# Patient Record
Sex: Female | Born: 1980 | Race: White | Hispanic: No | Marital: Married | Smoking: Never smoker
Health system: Southern US, Community
[De-identification: ages and names within clinical notes are randomized; demographics above are authoritative.]

## PROBLEM LIST (undated history)

## (undated) ENCOUNTER — Ambulatory Visit (HOSPITAL_COMMUNITY): Admission: EM | Payer: Self-pay

## (undated) DIAGNOSIS — G43909 Migraine, unspecified, not intractable, without status migrainosus: Secondary | ICD-10-CM

## (undated) DIAGNOSIS — R51 Headache: Secondary | ICD-10-CM

## (undated) DIAGNOSIS — N809 Endometriosis, unspecified: Secondary | ICD-10-CM

## (undated) DIAGNOSIS — E282 Polycystic ovarian syndrome: Secondary | ICD-10-CM

## (undated) DIAGNOSIS — N159 Renal tubulo-interstitial disease, unspecified: Secondary | ICD-10-CM

## (undated) HISTORY — PX: TUBAL LIGATION: SHX77

## (undated) HISTORY — PX: ENDOMETRIAL ABLATION: SHX621

## (undated) HISTORY — PX: ABDOMINAL HYSTERECTOMY: SHX81

---
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
## 2000-03-04 ENCOUNTER — Encounter: Admission: RE | Admit: 2000-03-04 | Discharge: 2000-03-04 | Payer: Self-pay | Admitting: Family Medicine

## 2000-03-09 ENCOUNTER — Inpatient Hospital Stay (HOSPITAL_COMMUNITY): Admission: RE | Admit: 2000-03-09 | Discharge: 2000-03-09 | Payer: Self-pay

## 2000-03-23 ENCOUNTER — Encounter: Admission: RE | Admit: 2000-03-23 | Discharge: 2000-03-23 | Payer: Self-pay | Admitting: Family Medicine

## 2000-04-18 ENCOUNTER — Inpatient Hospital Stay (HOSPITAL_COMMUNITY): Admission: AD | Admit: 2000-04-18 | Discharge: 2000-04-18 | Payer: Self-pay | Admitting: *Deleted

## 2000-04-29 ENCOUNTER — Encounter: Admission: RE | Admit: 2000-04-29 | Discharge: 2000-04-29 | Payer: Self-pay | Admitting: Family Medicine

## 2000-05-04 ENCOUNTER — Encounter: Payer: Self-pay | Admitting: *Deleted

## 2000-05-04 ENCOUNTER — Encounter: Admission: RE | Admit: 2000-05-04 | Discharge: 2000-05-04 | Payer: Self-pay | Admitting: Family Medicine

## 2000-05-04 ENCOUNTER — Inpatient Hospital Stay (HOSPITAL_COMMUNITY): Admission: AD | Admit: 2000-05-04 | Discharge: 2000-05-04 | Payer: Self-pay | Admitting: *Deleted

## 2000-05-17 ENCOUNTER — Encounter: Admission: RE | Admit: 2000-05-17 | Discharge: 2000-05-17 | Payer: Self-pay | Admitting: Family Medicine

## 2000-06-03 ENCOUNTER — Encounter: Admission: RE | Admit: 2000-06-03 | Discharge: 2000-06-03 | Payer: Self-pay | Admitting: Family Medicine

## 2000-06-17 ENCOUNTER — Encounter: Admission: RE | Admit: 2000-06-17 | Discharge: 2000-06-17 | Payer: Self-pay | Admitting: Family Medicine

## 2000-06-23 ENCOUNTER — Encounter: Admission: RE | Admit: 2000-06-23 | Discharge: 2000-06-23 | Payer: Self-pay | Admitting: Family Medicine

## 2000-06-25 ENCOUNTER — Ambulatory Visit (HOSPITAL_COMMUNITY): Admission: RE | Admit: 2000-06-25 | Discharge: 2000-06-25 | Payer: Self-pay | Admitting: *Deleted

## 2000-06-30 ENCOUNTER — Encounter: Admission: RE | Admit: 2000-06-30 | Discharge: 2000-06-30 | Payer: Self-pay | Admitting: Family Medicine

## 2000-07-08 ENCOUNTER — Encounter: Admission: RE | Admit: 2000-07-08 | Discharge: 2000-07-08 | Payer: Self-pay | Admitting: Family Medicine

## 2000-07-16 ENCOUNTER — Encounter: Admission: RE | Admit: 2000-07-16 | Discharge: 2000-07-16 | Payer: Self-pay | Admitting: Family Medicine

## 2000-07-20 ENCOUNTER — Encounter: Admission: RE | Admit: 2000-07-20 | Discharge: 2000-07-20 | Payer: Self-pay | Admitting: Family Medicine

## 2000-07-21 ENCOUNTER — Inpatient Hospital Stay (HOSPITAL_COMMUNITY): Admission: AD | Admit: 2000-07-21 | Discharge: 2000-07-21 | Payer: Self-pay | Admitting: Obstetrics

## 2000-07-29 ENCOUNTER — Encounter: Admission: RE | Admit: 2000-07-29 | Discharge: 2000-07-29 | Payer: Self-pay | Admitting: Family Medicine

## 2000-07-30 ENCOUNTER — Encounter (HOSPITAL_COMMUNITY): Admission: RE | Admit: 2000-07-30 | Discharge: 2000-08-05 | Payer: Self-pay | Admitting: *Deleted

## 2000-08-03 ENCOUNTER — Encounter: Admission: RE | Admit: 2000-08-03 | Discharge: 2000-08-03 | Payer: Self-pay | Admitting: Family Medicine

## 2000-08-04 ENCOUNTER — Inpatient Hospital Stay (HOSPITAL_COMMUNITY): Admission: AD | Admit: 2000-08-04 | Discharge: 2000-08-07 | Payer: Self-pay | Admitting: Obstetrics

## 2000-09-20 ENCOUNTER — Encounter: Admission: RE | Admit: 2000-09-20 | Discharge: 2000-09-20 | Payer: Self-pay | Admitting: Family Medicine

## 2000-09-27 ENCOUNTER — Encounter: Admission: RE | Admit: 2000-09-27 | Discharge: 2000-09-27 | Payer: Self-pay | Admitting: Family Medicine

## 2000-09-27 ENCOUNTER — Other Ambulatory Visit: Admission: RE | Admit: 2000-09-27 | Discharge: 2000-09-27 | Payer: Self-pay | Admitting: Family Medicine

## 2000-10-03 ENCOUNTER — Inpatient Hospital Stay (HOSPITAL_COMMUNITY): Admission: AD | Admit: 2000-10-03 | Discharge: 2000-10-03 | Payer: Self-pay | Admitting: Obstetrics & Gynecology

## 2000-10-03 ENCOUNTER — Encounter: Payer: Self-pay | Admitting: Obstetrics & Gynecology

## 2000-10-04 ENCOUNTER — Inpatient Hospital Stay (HOSPITAL_COMMUNITY): Admission: AD | Admit: 2000-10-04 | Discharge: 2000-10-07 | Payer: Self-pay | Admitting: Obstetrics

## 2000-10-08 ENCOUNTER — Encounter: Payer: Self-pay | Admitting: Family Medicine

## 2000-10-08 ENCOUNTER — Encounter: Admission: RE | Admit: 2000-10-08 | Discharge: 2000-10-08 | Payer: Self-pay | Admitting: Family Medicine

## 2000-10-08 ENCOUNTER — Encounter: Admission: RE | Admit: 2000-10-08 | Discharge: 2000-10-08 | Payer: Self-pay | Admitting: *Deleted

## 2000-10-11 ENCOUNTER — Encounter: Admission: RE | Admit: 2000-10-11 | Discharge: 2000-10-11 | Payer: Self-pay | Admitting: *Deleted

## 2000-10-11 ENCOUNTER — Encounter: Payer: Self-pay | Admitting: Family Medicine

## 2001-07-07 ENCOUNTER — Encounter: Admission: RE | Admit: 2001-07-07 | Discharge: 2001-07-07 | Payer: Self-pay | Admitting: Family Medicine

## 2001-07-08 ENCOUNTER — Encounter: Admission: RE | Admit: 2001-07-08 | Discharge: 2001-07-08 | Payer: Self-pay | Admitting: Family Medicine

## 2001-07-19 ENCOUNTER — Encounter: Payer: Self-pay | Admitting: Emergency Medicine

## 2001-07-19 ENCOUNTER — Emergency Department (HOSPITAL_COMMUNITY): Admission: EM | Admit: 2001-07-19 | Discharge: 2001-07-19 | Payer: Self-pay | Admitting: Emergency Medicine

## 2001-08-08 ENCOUNTER — Inpatient Hospital Stay (HOSPITAL_COMMUNITY): Admission: AD | Admit: 2001-08-08 | Discharge: 2001-08-08 | Payer: Self-pay | Admitting: Obstetrics

## 2001-08-08 ENCOUNTER — Encounter: Payer: Self-pay | Admitting: Obstetrics & Gynecology

## 2002-01-10 ENCOUNTER — Inpatient Hospital Stay (HOSPITAL_COMMUNITY): Admission: AD | Admit: 2002-01-10 | Discharge: 2002-01-10 | Payer: Self-pay | Admitting: *Deleted

## 2002-07-25 ENCOUNTER — Inpatient Hospital Stay (HOSPITAL_COMMUNITY): Admission: AD | Admit: 2002-07-25 | Discharge: 2002-07-25 | Payer: Self-pay

## 2003-01-03 ENCOUNTER — Encounter: Admission: RE | Admit: 2003-01-03 | Discharge: 2003-01-03 | Payer: Self-pay | Admitting: Family Medicine

## 2003-01-08 ENCOUNTER — Encounter: Admission: RE | Admit: 2003-01-08 | Discharge: 2003-01-08 | Payer: Self-pay | Admitting: Family Medicine

## 2003-01-19 ENCOUNTER — Encounter: Admission: RE | Admit: 2003-01-19 | Discharge: 2003-01-19 | Payer: Self-pay | Admitting: Family Medicine

## 2003-05-21 ENCOUNTER — Inpatient Hospital Stay (HOSPITAL_COMMUNITY): Admission: AD | Admit: 2003-05-21 | Discharge: 2003-05-21 | Payer: Self-pay | Admitting: *Deleted

## 2004-01-28 ENCOUNTER — Encounter: Admission: RE | Admit: 2004-01-28 | Discharge: 2004-01-28 | Payer: Self-pay | Admitting: Family Medicine

## 2004-02-15 ENCOUNTER — Emergency Department (HOSPITAL_COMMUNITY): Admission: EM | Admit: 2004-02-15 | Discharge: 2004-02-15 | Payer: Self-pay | Admitting: Emergency Medicine

## 2004-09-25 ENCOUNTER — Emergency Department (HOSPITAL_COMMUNITY): Admission: EM | Admit: 2004-09-25 | Discharge: 2004-09-25 | Payer: Self-pay | Admitting: Emergency Medicine

## 2004-11-03 ENCOUNTER — Inpatient Hospital Stay (HOSPITAL_COMMUNITY): Admission: AD | Admit: 2004-11-03 | Discharge: 2004-11-03 | Payer: Self-pay | Admitting: Family Medicine

## 2005-03-14 ENCOUNTER — Emergency Department (HOSPITAL_COMMUNITY): Admission: EM | Admit: 2005-03-14 | Discharge: 2005-03-14 | Payer: Self-pay | Admitting: Emergency Medicine

## 2005-04-25 ENCOUNTER — Encounter (INDEPENDENT_AMBULATORY_CARE_PROVIDER_SITE_OTHER): Payer: Self-pay | Admitting: *Deleted

## 2005-04-25 LAB — CONVERTED CEMR LAB

## 2005-04-27 ENCOUNTER — Ambulatory Visit: Payer: Self-pay | Admitting: Family Medicine

## 2005-04-27 ENCOUNTER — Encounter: Payer: Self-pay | Admitting: Family Medicine

## 2005-11-22 ENCOUNTER — Inpatient Hospital Stay (HOSPITAL_COMMUNITY): Admission: AD | Admit: 2005-11-22 | Discharge: 2005-11-22 | Payer: Self-pay | Admitting: Obstetrics and Gynecology

## 2005-12-28 ENCOUNTER — Ambulatory Visit: Payer: Self-pay | Admitting: Sports Medicine

## 2006-01-06 ENCOUNTER — Ambulatory Visit (HOSPITAL_COMMUNITY): Admission: RE | Admit: 2006-01-06 | Discharge: 2006-01-07 | Payer: Self-pay | Admitting: Orthopedic Surgery

## 2006-01-18 ENCOUNTER — Ambulatory Visit: Payer: Self-pay | Admitting: Family Medicine

## 2006-01-25 ENCOUNTER — Ambulatory Visit: Payer: Self-pay | Admitting: Family Medicine

## 2006-02-12 ENCOUNTER — Emergency Department (HOSPITAL_COMMUNITY): Admission: EM | Admit: 2006-02-12 | Discharge: 2006-02-13 | Payer: Self-pay | Admitting: Emergency Medicine

## 2006-10-28 ENCOUNTER — Ambulatory Visit: Payer: Self-pay | Admitting: Family Medicine

## 2006-10-28 ENCOUNTER — Encounter: Payer: Self-pay | Admitting: Family Medicine

## 2006-10-28 LAB — CONVERTED CEMR LAB
Basophils Relative: 0 % (ref 0–1)
Hemoglobin: 13.8 g/dL (ref 12.0–15.0)
Hepatitis B Surface Ag: NEGATIVE
Lymphs Abs: 1.5 10*3/uL (ref 0.7–3.3)
MCV: 88.8 fL (ref 78.0–100.0)
Monocytes Relative: 8 % (ref 3–11)
Neutro Abs: 4.6 10*3/uL (ref 1.7–7.7)
Neutrophils Relative %: 69 % (ref 43–77)
RBC: 4.56 M/uL (ref 3.87–5.11)
RDW: 13.5 % (ref 11.5–14.0)
WBC: 6.6 10*3/uL (ref 4.0–10.5)

## 2006-11-02 ENCOUNTER — Ambulatory Visit (HOSPITAL_COMMUNITY): Admission: RE | Admit: 2006-11-02 | Discharge: 2006-11-02 | Payer: Self-pay | Admitting: Obstetrics and Gynecology

## 2006-11-04 ENCOUNTER — Inpatient Hospital Stay (HOSPITAL_COMMUNITY): Admission: AD | Admit: 2006-11-04 | Discharge: 2006-11-04 | Payer: Self-pay | Admitting: Obstetrics and Gynecology

## 2006-11-25 ENCOUNTER — Ambulatory Visit: Payer: Self-pay | Admitting: Family Medicine

## 2006-11-25 ENCOUNTER — Encounter: Payer: Self-pay | Admitting: Family Medicine

## 2006-12-23 DIAGNOSIS — K5900 Constipation, unspecified: Secondary | ICD-10-CM | POA: Insufficient documentation

## 2006-12-23 DIAGNOSIS — M25569 Pain in unspecified knee: Secondary | ICD-10-CM | POA: Insufficient documentation

## 2006-12-23 DIAGNOSIS — E669 Obesity, unspecified: Secondary | ICD-10-CM | POA: Insufficient documentation

## 2006-12-24 ENCOUNTER — Encounter (INDEPENDENT_AMBULATORY_CARE_PROVIDER_SITE_OTHER): Payer: Self-pay | Admitting: *Deleted

## 2006-12-29 ENCOUNTER — Telehealth: Payer: Self-pay | Admitting: *Deleted

## 2006-12-31 ENCOUNTER — Ambulatory Visit: Payer: Self-pay | Admitting: Sports Medicine

## 2007-01-04 ENCOUNTER — Telehealth: Payer: Self-pay | Admitting: *Deleted

## 2007-01-04 ENCOUNTER — Telehealth (INDEPENDENT_AMBULATORY_CARE_PROVIDER_SITE_OTHER): Payer: Self-pay | Admitting: *Deleted

## 2007-01-17 ENCOUNTER — Telehealth: Payer: Self-pay | Admitting: *Deleted

## 2007-01-19 ENCOUNTER — Telehealth: Payer: Self-pay | Admitting: Family Medicine

## 2007-01-24 ENCOUNTER — Encounter: Payer: Self-pay | Admitting: *Deleted

## 2007-01-24 ENCOUNTER — Ambulatory Visit: Payer: Self-pay | Admitting: Family Medicine

## 2007-02-04 ENCOUNTER — Ambulatory Visit: Payer: Self-pay | Admitting: Family Medicine

## 2007-02-07 ENCOUNTER — Ambulatory Visit (HOSPITAL_COMMUNITY): Admission: RE | Admit: 2007-02-07 | Discharge: 2007-02-07 | Payer: Self-pay | Admitting: Family Medicine

## 2007-02-07 ENCOUNTER — Encounter: Payer: Self-pay | Admitting: Family Medicine

## 2007-02-07 ENCOUNTER — Encounter: Payer: Self-pay | Admitting: *Deleted

## 2007-02-23 ENCOUNTER — Ambulatory Visit (HOSPITAL_COMMUNITY): Admission: RE | Admit: 2007-02-23 | Discharge: 2007-02-23 | Payer: Self-pay | Admitting: Internal Medicine

## 2007-02-25 ENCOUNTER — Telehealth: Payer: Self-pay | Admitting: Family Medicine

## 2007-03-03 ENCOUNTER — Ambulatory Visit: Payer: Self-pay | Admitting: Family Medicine

## 2007-04-06 ENCOUNTER — Ambulatory Visit: Payer: Self-pay | Admitting: Sports Medicine

## 2007-04-07 ENCOUNTER — Ambulatory Visit: Payer: Self-pay | Admitting: Sports Medicine

## 2007-04-07 ENCOUNTER — Encounter: Payer: Self-pay | Admitting: Family Medicine

## 2007-04-08 ENCOUNTER — Telehealth: Payer: Self-pay | Admitting: Family Medicine

## 2007-04-14 ENCOUNTER — Telehealth: Payer: Self-pay | Admitting: Family Medicine

## 2007-04-15 ENCOUNTER — Ambulatory Visit: Payer: Self-pay | Admitting: Family Medicine

## 2007-04-15 ENCOUNTER — Encounter: Payer: Self-pay | Admitting: Family Medicine

## 2007-04-15 LAB — CONVERTED CEMR LAB
AST: 11 units/L (ref 0–37)
Albumin: 3.6 g/dL (ref 3.5–5.2)
BUN: 9 mg/dL (ref 6–23)
CO2: 21 meq/L (ref 19–32)
Creatinine, Ser: 0.65 mg/dL (ref 0.40–1.20)
Glucose, Bld: 88 mg/dL (ref 70–99)
MCV: 92.6 fL
Platelets: 203 10*3/uL
Potassium: 3.9 meq/L (ref 3.5–5.3)
Total Bilirubin: 0.4 mg/dL (ref 0.3–1.2)

## 2007-04-18 ENCOUNTER — Ambulatory Visit: Payer: Self-pay | Admitting: Family Medicine

## 2007-04-19 ENCOUNTER — Encounter: Payer: Self-pay | Admitting: Family Medicine

## 2007-04-19 LAB — CONVERTED CEMR LAB
Creatinine 24 HR UR: 1666 mg/24hr (ref 700–1800)
Creatinine, Urine: 119 mg/dL

## 2007-05-04 ENCOUNTER — Ambulatory Visit: Payer: Self-pay | Admitting: Family Medicine

## 2007-05-19 ENCOUNTER — Ambulatory Visit: Payer: Self-pay | Admitting: Family Medicine

## 2007-06-01 ENCOUNTER — Ambulatory Visit: Payer: Self-pay | Admitting: Family Medicine

## 2007-06-01 ENCOUNTER — Encounter: Payer: Self-pay | Admitting: Family Medicine

## 2007-06-01 LAB — CONVERTED CEMR LAB
ALT: 12 units/L (ref 0–35)
AST: 12 units/L (ref 0–37)
BUN: 6 mg/dL (ref 6–23)
Creatinine, Ser: 0.42 mg/dL (ref 0.40–1.20)
Hemoglobin: 12.4 g/dL (ref 12.0–15.0)
MCV: 90.7 fL (ref 78.0–100.0)
Platelets: 197 10*3/uL (ref 150–400)
Total Protein: 6.3 g/dL (ref 6.0–8.3)
WBC: 7.6 10*3/uL (ref 4.0–10.5)

## 2007-06-02 ENCOUNTER — Encounter: Payer: Self-pay | Admitting: Family Medicine

## 2007-06-02 ENCOUNTER — Ambulatory Visit: Payer: Self-pay | Admitting: Gynecology

## 2007-06-02 ENCOUNTER — Ambulatory Visit (HOSPITAL_COMMUNITY): Admission: RE | Admit: 2007-06-02 | Discharge: 2007-06-02 | Payer: Self-pay | Admitting: Family Medicine

## 2007-06-03 ENCOUNTER — Ambulatory Visit: Payer: Self-pay | Admitting: Family Medicine

## 2007-06-03 ENCOUNTER — Encounter: Payer: Self-pay | Admitting: Family Medicine

## 2007-06-04 ENCOUNTER — Encounter: Payer: Self-pay | Admitting: Family Medicine

## 2007-06-06 ENCOUNTER — Encounter: Payer: Self-pay | Admitting: Family Medicine

## 2007-06-06 LAB — CONVERTED CEMR LAB: Protein, Ur: 96 mg/24hr (ref 50–100)

## 2007-06-09 ENCOUNTER — Encounter (INDEPENDENT_AMBULATORY_CARE_PROVIDER_SITE_OTHER): Payer: Self-pay | Admitting: Family Medicine

## 2007-06-09 ENCOUNTER — Ambulatory Visit: Payer: Self-pay | Admitting: Family Medicine

## 2007-06-14 ENCOUNTER — Encounter: Payer: Self-pay | Admitting: Family Medicine

## 2007-06-14 ENCOUNTER — Encounter: Payer: Self-pay | Admitting: *Deleted

## 2007-06-15 ENCOUNTER — Ambulatory Visit: Payer: Self-pay | Admitting: Family Medicine

## 2007-06-21 ENCOUNTER — Ambulatory Visit: Payer: Self-pay | Admitting: Family Medicine

## 2007-06-21 LAB — CONVERTED CEMR LAB
ALT: 14 units/L (ref 0–35)
Alkaline Phosphatase: 94 units/L (ref 39–117)
BUN: 6 mg/dL (ref 6–23)
Basophils Absolute: 0 10*3/uL (ref 0.0–0.1)
Basophils Relative: 0 % (ref 0–1)
CO2: 19 meq/L (ref 19–32)
Chloride: 107 meq/L (ref 96–112)
Glucose, Bld: 91 mg/dL (ref 70–99)
Hemoglobin: 13.1 g/dL (ref 12.0–15.0)
Lymphocytes Relative: 17 % (ref 12–46)
Lymphs Abs: 1.3 10*3/uL (ref 0.7–3.3)
MCHC: 36.1 g/dL — ABNORMAL HIGH (ref 30.0–36.0)
Monocytes Absolute: 0.7 10*3/uL (ref 0.2–0.7)
Monocytes Relative: 9 % (ref 3–11)
Neutrophils Relative %: 73 % (ref 43–77)
Platelets: 200 10*3/uL (ref 150–400)
RBC: 4.06 M/uL (ref 3.87–5.11)
RDW: 14.5 % — ABNORMAL HIGH (ref 11.5–14.0)
Total Protein: 6.2 g/dL (ref 6.0–8.3)

## 2007-06-23 ENCOUNTER — Encounter: Payer: Self-pay | Admitting: Family Medicine

## 2007-06-23 LAB — CONVERTED CEMR LAB
Creatinine 24 HR UR: 1568 mg/24hr (ref 700–1800)
Creatinine Clearance: 218 mL/min — ABNORMAL HIGH (ref 75–115)
Creatinine, Urine: 78.4 mg/dL

## 2007-06-24 ENCOUNTER — Telehealth: Payer: Self-pay | Admitting: *Deleted

## 2007-06-24 ENCOUNTER — Inpatient Hospital Stay (HOSPITAL_COMMUNITY): Admission: AD | Admit: 2007-06-24 | Discharge: 2007-06-24 | Payer: Self-pay | Admitting: Gynecology

## 2007-06-24 ENCOUNTER — Ambulatory Visit: Payer: Self-pay | Admitting: Obstetrics and Gynecology

## 2007-06-27 ENCOUNTER — Telehealth (INDEPENDENT_AMBULATORY_CARE_PROVIDER_SITE_OTHER): Payer: Self-pay | Admitting: Family Medicine

## 2007-06-28 ENCOUNTER — Inpatient Hospital Stay (HOSPITAL_COMMUNITY): Admission: AD | Admit: 2007-06-28 | Discharge: 2007-06-30 | Payer: Self-pay | Admitting: Obstetrics and Gynecology

## 2007-06-28 ENCOUNTER — Ambulatory Visit: Payer: Self-pay | Admitting: Internal Medicine

## 2007-06-28 ENCOUNTER — Ambulatory Visit: Payer: Self-pay | Admitting: Gynecology

## 2007-06-28 DIAGNOSIS — R300 Dysuria: Secondary | ICD-10-CM

## 2007-06-28 LAB — CONVERTED CEMR LAB
Bilirubin Urine: NEGATIVE
Blood in Urine, dipstick: NEGATIVE
Glucose, Urine, Semiquant: NEGATIVE
Nitrite: POSITIVE
Protein, U semiquant: 30
Specific Gravity, Urine: >=1.03
Urobilinogen, UA: 1
pH: 6

## 2007-08-01 ENCOUNTER — Encounter (INDEPENDENT_AMBULATORY_CARE_PROVIDER_SITE_OTHER): Payer: Self-pay | Admitting: Family Medicine

## 2007-08-01 ENCOUNTER — Ambulatory Visit: Payer: Self-pay | Admitting: Sports Medicine

## 2007-08-01 LAB — CONVERTED CEMR LAB: Blood in Urine, dipstick: NEGATIVE

## 2007-08-03 ENCOUNTER — Encounter (INDEPENDENT_AMBULATORY_CARE_PROVIDER_SITE_OTHER): Payer: Self-pay | Admitting: Family Medicine

## 2007-08-09 ENCOUNTER — Ambulatory Visit: Payer: Self-pay | Admitting: Family Medicine

## 2007-09-15 ENCOUNTER — Telehealth: Payer: Self-pay | Admitting: *Deleted

## 2007-10-21 ENCOUNTER — Emergency Department (HOSPITAL_COMMUNITY): Admission: EM | Admit: 2007-10-21 | Discharge: 2007-10-21 | Payer: Self-pay | Admitting: Emergency Medicine

## 2007-10-25 ENCOUNTER — Telehealth: Payer: Self-pay | Admitting: *Deleted

## 2007-11-14 ENCOUNTER — Encounter: Payer: Self-pay | Admitting: Family Medicine

## 2007-11-14 ENCOUNTER — Ambulatory Visit: Payer: Self-pay | Admitting: Sports Medicine

## 2007-11-14 LAB — CONVERTED CEMR LAB
Pap Smear: NORMAL
Total CHOL/HDL Ratio: 3.5

## 2007-11-21 ENCOUNTER — Telehealth: Payer: Self-pay | Admitting: *Deleted

## 2008-02-10 ENCOUNTER — Ambulatory Visit: Payer: Self-pay | Admitting: Family Medicine

## 2008-02-10 ENCOUNTER — Telehealth (INDEPENDENT_AMBULATORY_CARE_PROVIDER_SITE_OTHER): Payer: Self-pay | Admitting: Family Medicine

## 2008-02-10 DIAGNOSIS — J069 Acute upper respiratory infection, unspecified: Secondary | ICD-10-CM | POA: Insufficient documentation

## 2008-02-10 LAB — CONVERTED CEMR LAB: Rapid Strep: NEGATIVE

## 2008-02-12 ENCOUNTER — Telehealth (INDEPENDENT_AMBULATORY_CARE_PROVIDER_SITE_OTHER): Payer: Self-pay | Admitting: Family Medicine

## 2008-02-13 ENCOUNTER — Ambulatory Visit: Payer: Self-pay | Admitting: Family Medicine

## 2008-02-13 ENCOUNTER — Telehealth (INDEPENDENT_AMBULATORY_CARE_PROVIDER_SITE_OTHER): Payer: Self-pay | Admitting: *Deleted

## 2008-02-28 ENCOUNTER — Telehealth (INDEPENDENT_AMBULATORY_CARE_PROVIDER_SITE_OTHER): Payer: Self-pay | Admitting: Family Medicine

## 2008-03-05 ENCOUNTER — Emergency Department (HOSPITAL_COMMUNITY): Admission: EM | Admit: 2008-03-05 | Discharge: 2008-03-05 | Payer: Self-pay | Admitting: Emergency Medicine

## 2008-04-28 ENCOUNTER — Telehealth: Payer: Self-pay | Admitting: Family Medicine

## 2008-05-25 ENCOUNTER — Telehealth: Payer: Self-pay | Admitting: *Deleted

## 2008-05-28 ENCOUNTER — Encounter (INDEPENDENT_AMBULATORY_CARE_PROVIDER_SITE_OTHER): Payer: Self-pay | Admitting: Family Medicine

## 2008-05-28 ENCOUNTER — Ambulatory Visit: Payer: Self-pay | Admitting: Family Medicine

## 2008-05-28 DIAGNOSIS — N926 Irregular menstruation, unspecified: Secondary | ICD-10-CM

## 2008-05-28 DIAGNOSIS — R5383 Other fatigue: Secondary | ICD-10-CM

## 2008-05-28 DIAGNOSIS — R5381 Other malaise: Secondary | ICD-10-CM

## 2008-05-28 DIAGNOSIS — R109 Unspecified abdominal pain: Secondary | ICD-10-CM

## 2008-05-28 LAB — CONVERTED CEMR LAB
Bilirubin Urine: NEGATIVE
Blood in Urine, dipstick: NEGATIVE
Ketones, urine, test strip: NEGATIVE
Nitrite: NEGATIVE
Protein, U semiquant: NEGATIVE
Specific Gravity, Urine: 1.025
Urobilinogen, UA: 0.2
WBC Urine, dipstick: NEGATIVE

## 2008-05-29 LAB — CONVERTED CEMR LAB
ALT: 20 units/L (ref 0–35)
Albumin: 4.9 g/dL (ref 3.5–5.2)
BUN: 11 mg/dL (ref 6–23)
Chloride: 104 meq/L (ref 96–112)
GC Probe Amp, Genital: NEGATIVE
Glucose, Bld: 84 mg/dL (ref 70–99)
Platelets: 215 10*3/uL (ref 150–400)
Potassium: 4.9 meq/L (ref 3.5–5.3)
RDW: 13.9 % (ref 11.5–15.5)
Sodium: 138 meq/L (ref 135–145)
Total Protein: 7.6 g/dL (ref 6.0–8.3)

## 2008-08-14 ENCOUNTER — Telehealth: Payer: Self-pay | Admitting: *Deleted

## 2008-08-16 ENCOUNTER — Ambulatory Visit: Payer: Self-pay | Admitting: Family Medicine

## 2008-08-16 LAB — CONVERTED CEMR LAB
HCT: 38.4 % (ref 36.0–46.0)
MCV: 90.8 fL (ref 78.0–100.0)
WBC: 5.7 10*3/uL (ref 4.0–10.5)

## 2008-10-12 ENCOUNTER — Ambulatory Visit (HOSPITAL_BASED_OUTPATIENT_CLINIC_OR_DEPARTMENT_OTHER): Admission: RE | Admit: 2008-10-12 | Discharge: 2008-10-12 | Payer: Self-pay | Admitting: Gynecology

## 2008-10-12 ENCOUNTER — Encounter (INDEPENDENT_AMBULATORY_CARE_PROVIDER_SITE_OTHER): Payer: Self-pay | Admitting: Gynecology

## 2008-10-22 ENCOUNTER — Telehealth: Payer: Self-pay | Admitting: Family Medicine

## 2009-03-19 ENCOUNTER — Ambulatory Visit: Payer: Self-pay | Admitting: Family Medicine

## 2009-03-19 DIAGNOSIS — J029 Acute pharyngitis, unspecified: Secondary | ICD-10-CM

## 2009-03-20 ENCOUNTER — Telehealth: Payer: Self-pay | Admitting: Family Medicine

## 2009-03-20 ENCOUNTER — Emergency Department (HOSPITAL_COMMUNITY): Admission: EM | Admit: 2009-03-20 | Discharge: 2009-03-20 | Payer: Self-pay | Admitting: Emergency Medicine

## 2009-04-08 IMAGING — US US OB FOLLOW-UP
1 series · 14 of 24 positions shown · non-contrast
Comparison: none

OBSTETRICAL ULTRASOUND:

 This ultrasound exam was performed in the [HOSPITAL] Ultrasound Department.  The OB US report was generated in the AS system, and faxed to the ordering physician.  This report is also available in [REDACTED] PACS.

[Series 1: us ob follow-up · 0.33mm/px · 24 acquisitions, 14 frames shown]
[im 1/24]
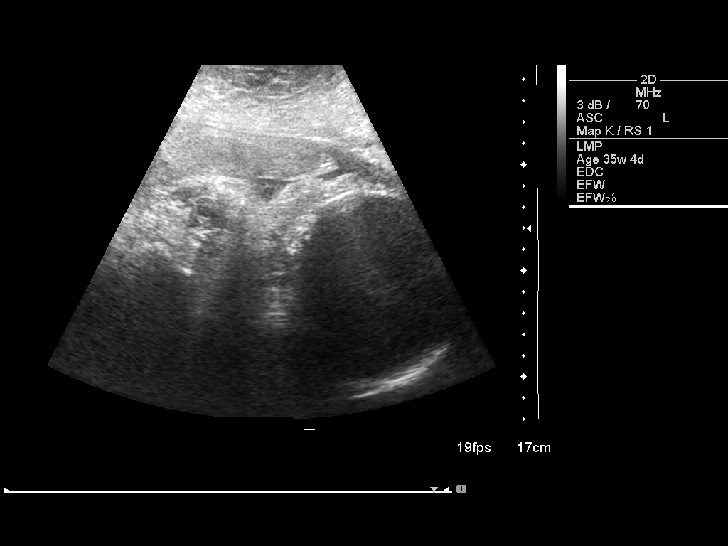
[im 3/24]
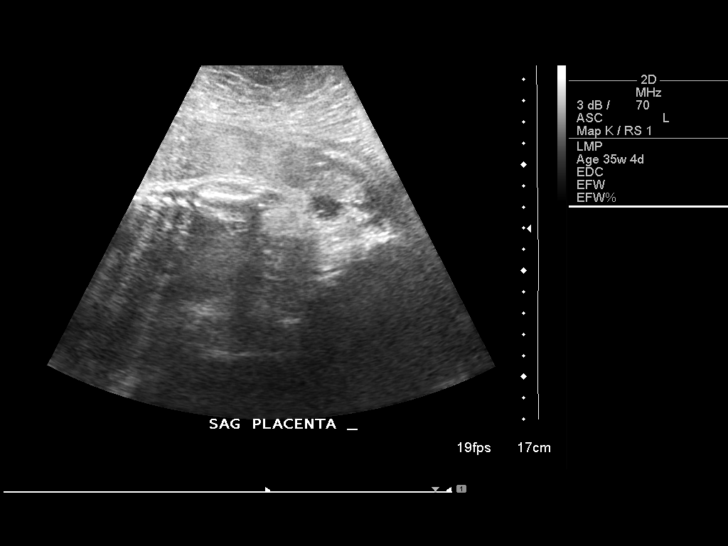
[im 5/24]
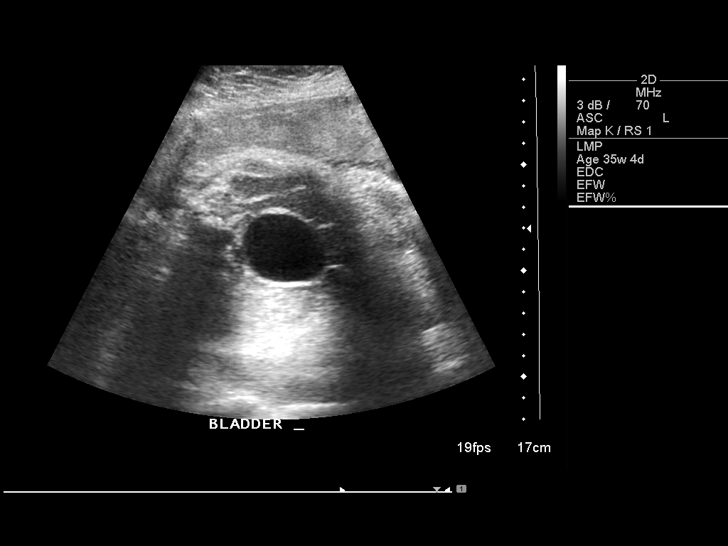
[im 7/24]
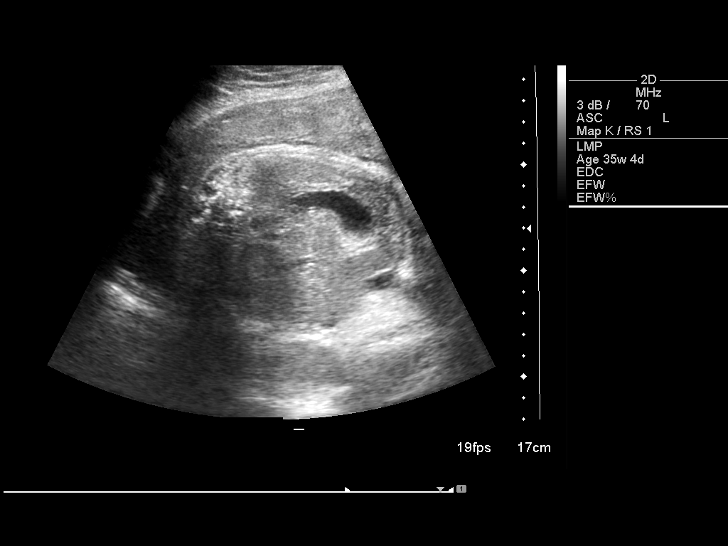
[im 8/24]
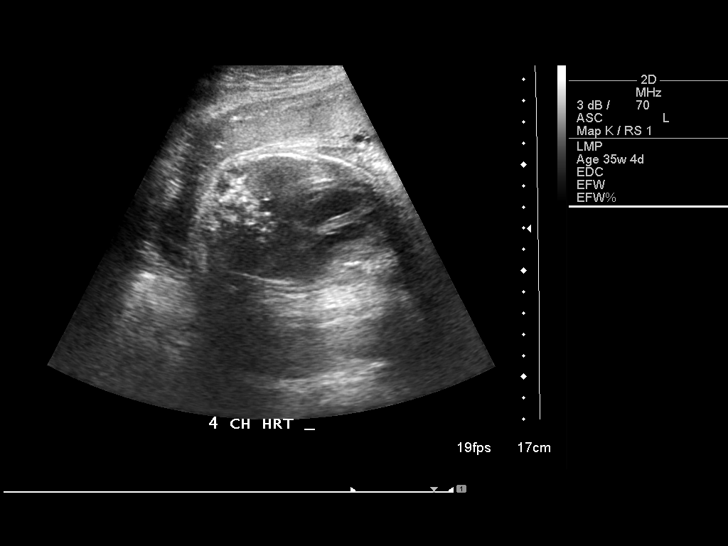
[im 10/24]
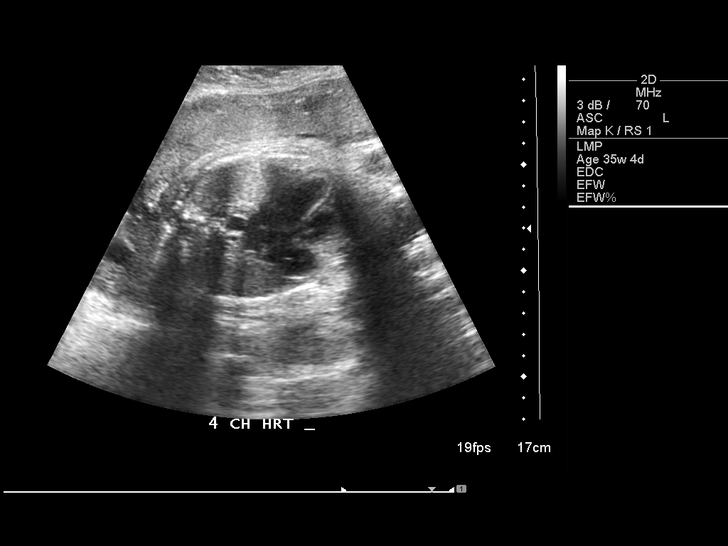
[im 12/24]
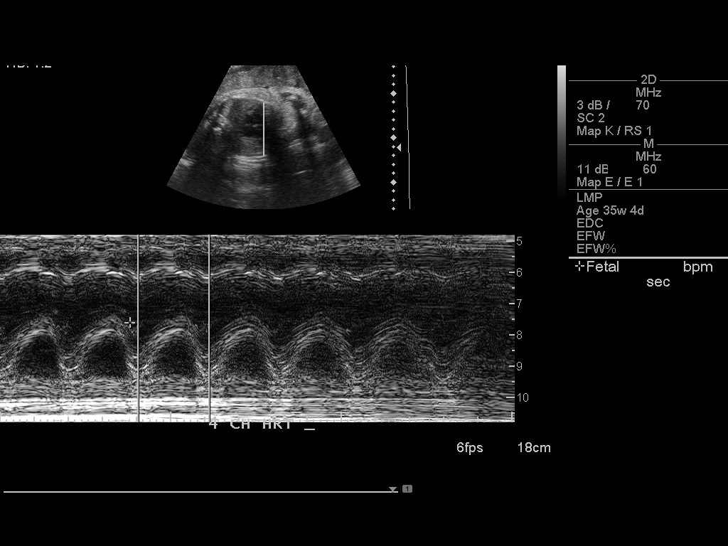
[im 13/24]
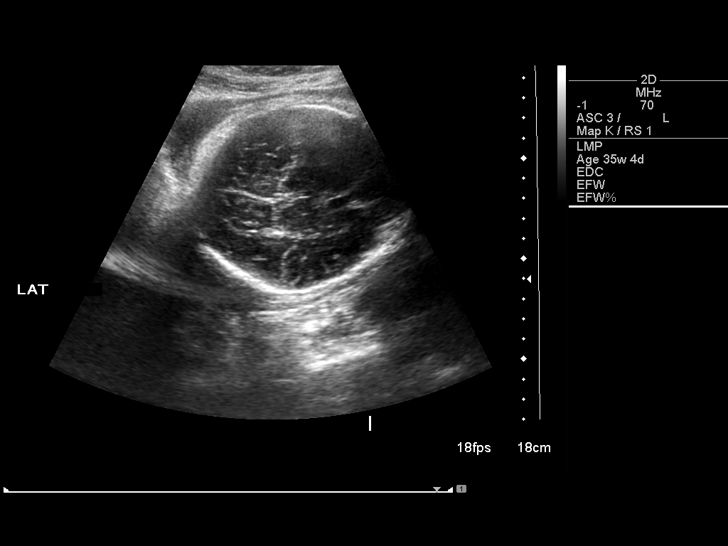
[im 15/24]
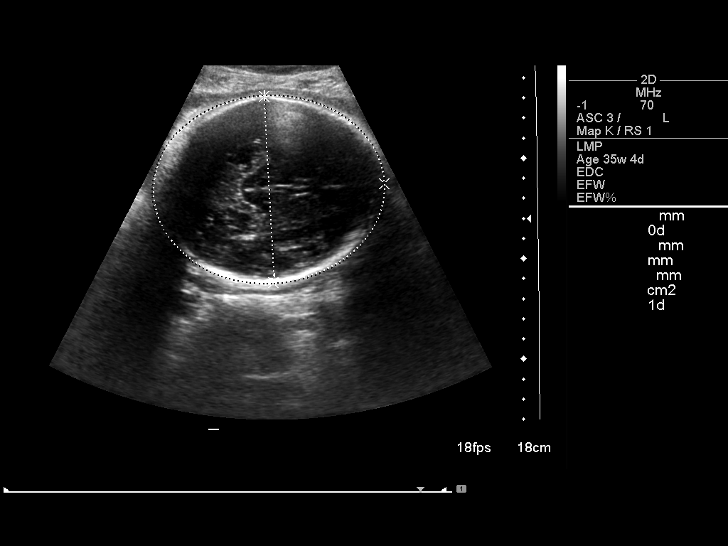
[im 17/24]
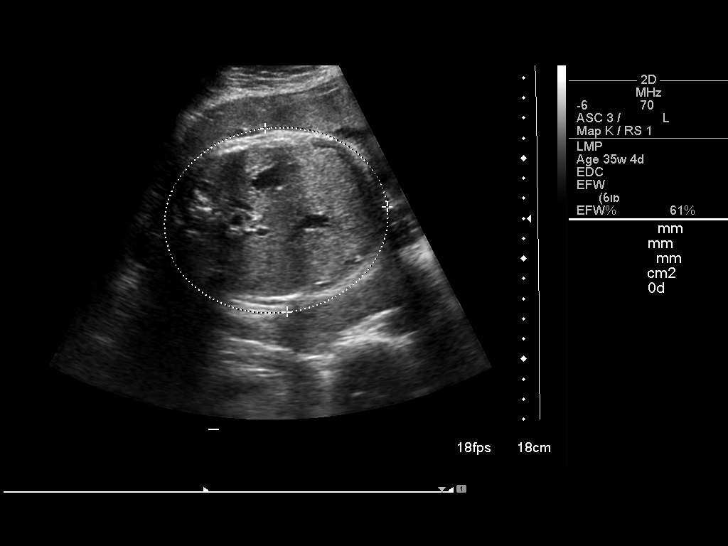
[im 19/24]
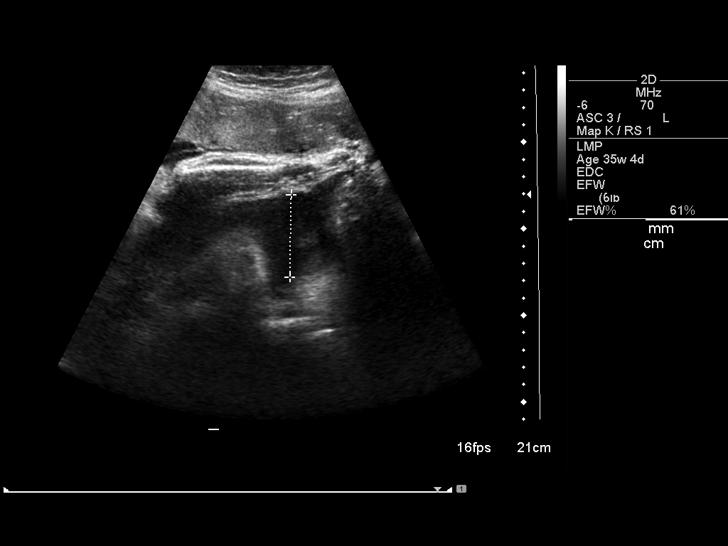
[im 20/24]
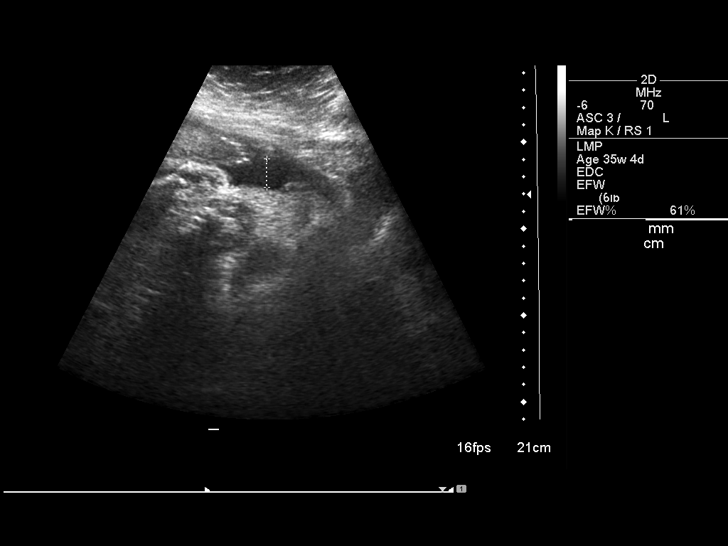
[im 22/24]
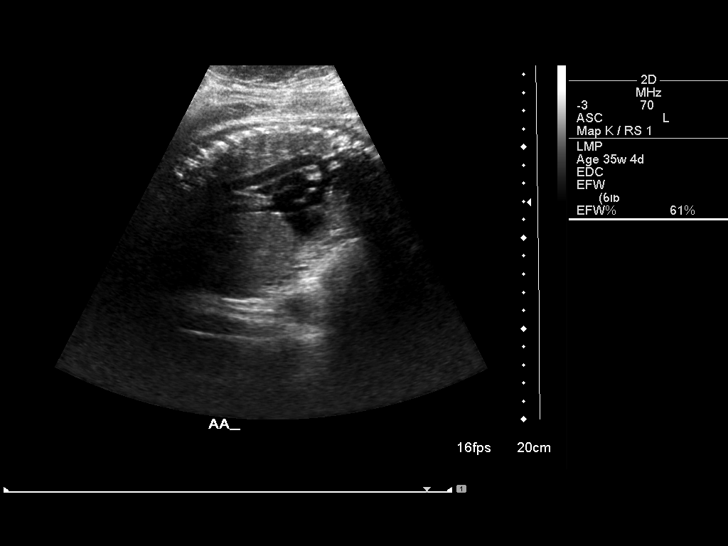
[im 24/24]
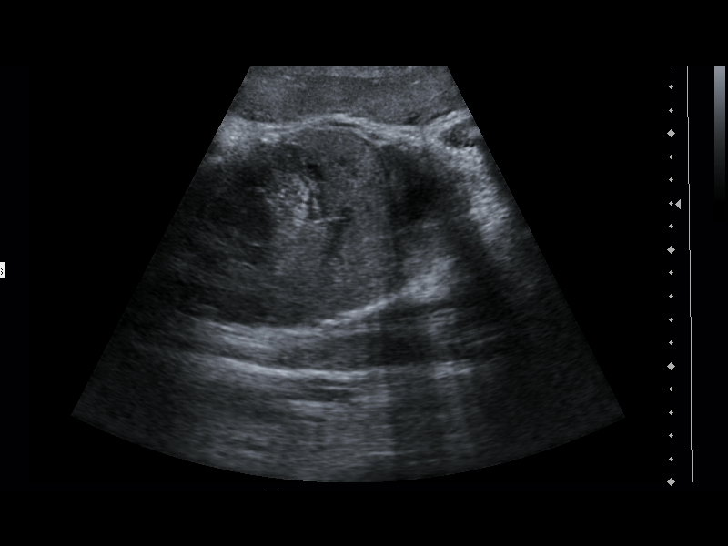

[14 of 24 positions shown; findings below may reference images not displayed]

IMPRESSION: See AS Obstetric US report.

## 2009-07-14 ENCOUNTER — Emergency Department (HOSPITAL_COMMUNITY): Admission: EM | Admit: 2009-07-14 | Discharge: 2009-07-14 | Payer: Self-pay | Admitting: Emergency Medicine

## 2009-08-08 ENCOUNTER — Encounter (INDEPENDENT_AMBULATORY_CARE_PROVIDER_SITE_OTHER): Payer: Self-pay

## 2009-08-27 IMAGING — CR DG CHEST 2V
2 series · 2 of 2 positions shown · non-contrast
Comparison: None.

CLINICAL DATA: Cough, congestion.
 CHEST - 2 VIEW - 10/21/07 AT 9565 HOURS:

[w chest pa]
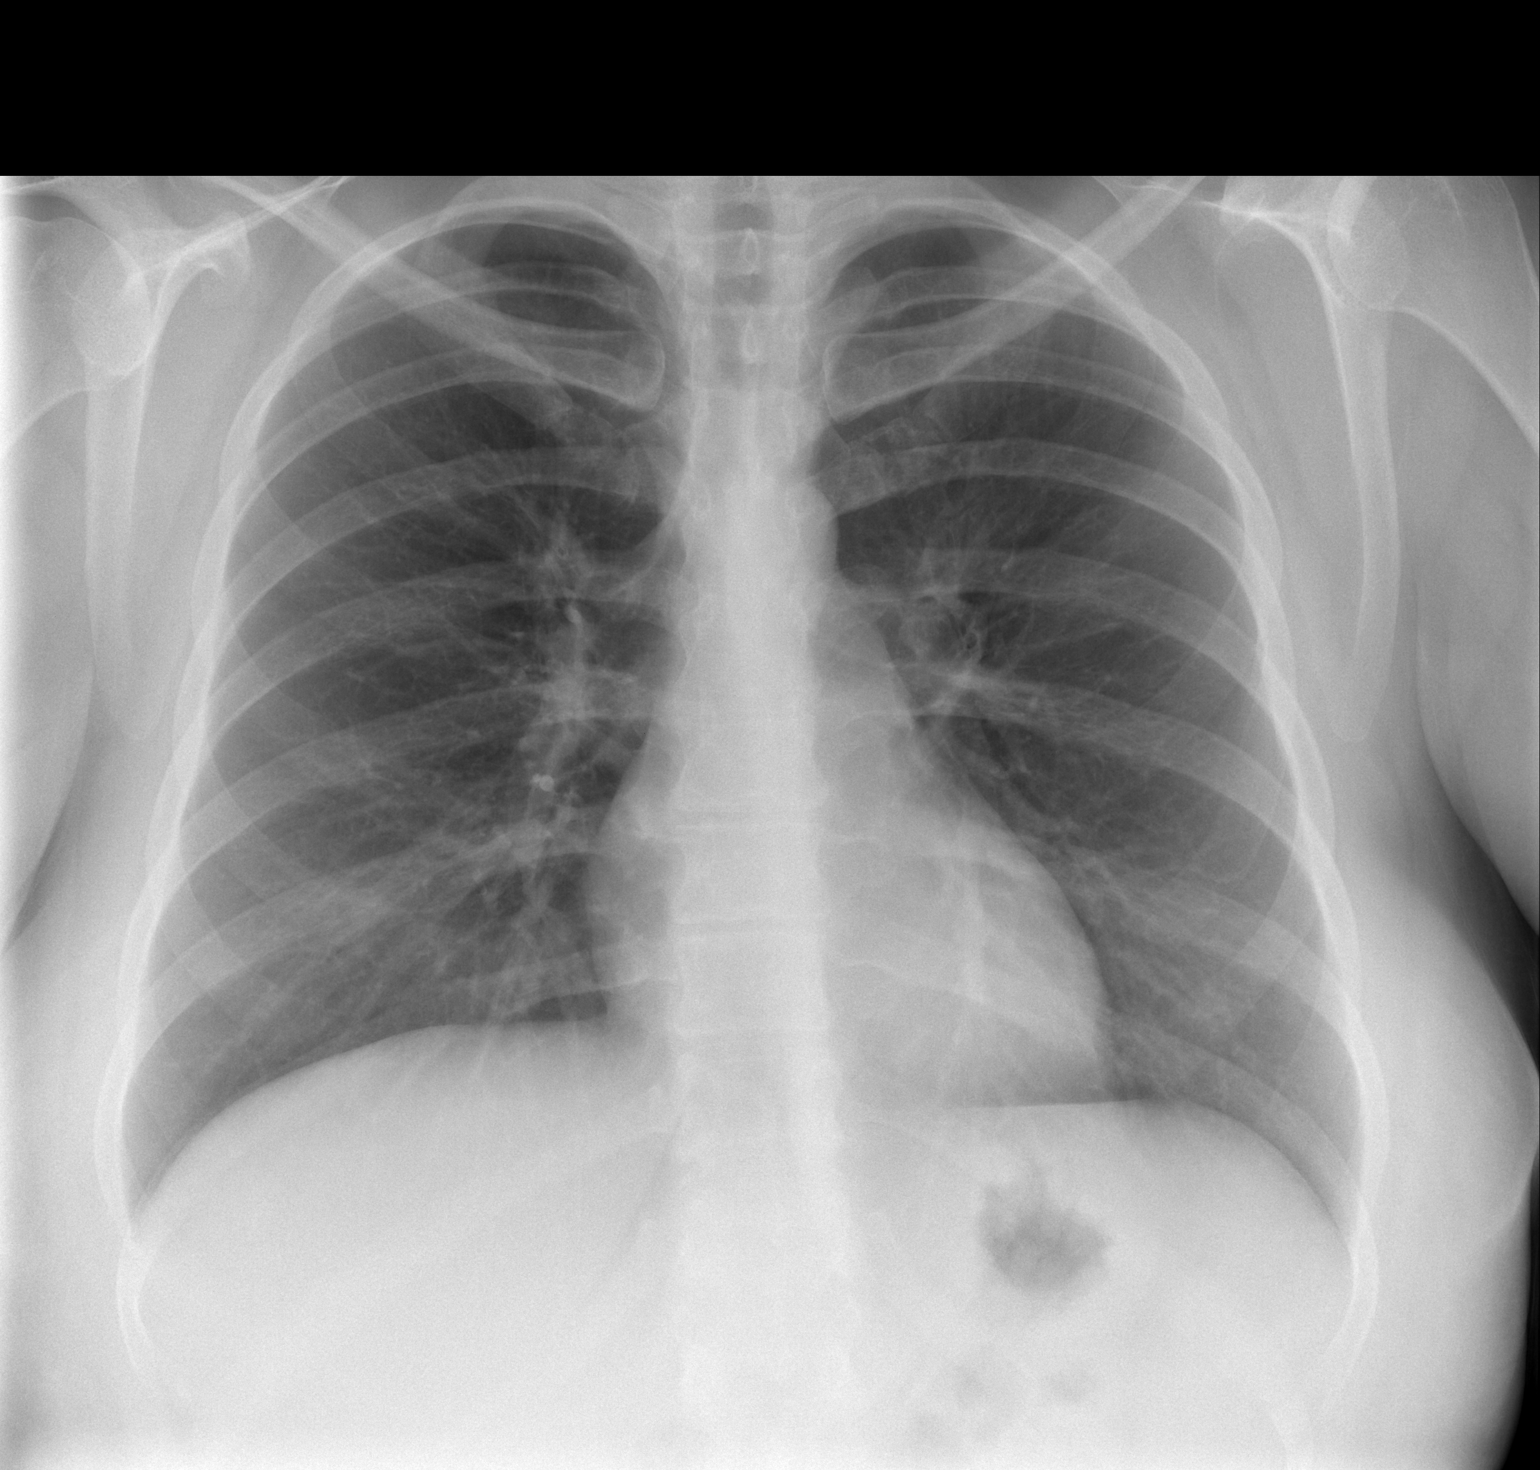

[w chest lat]
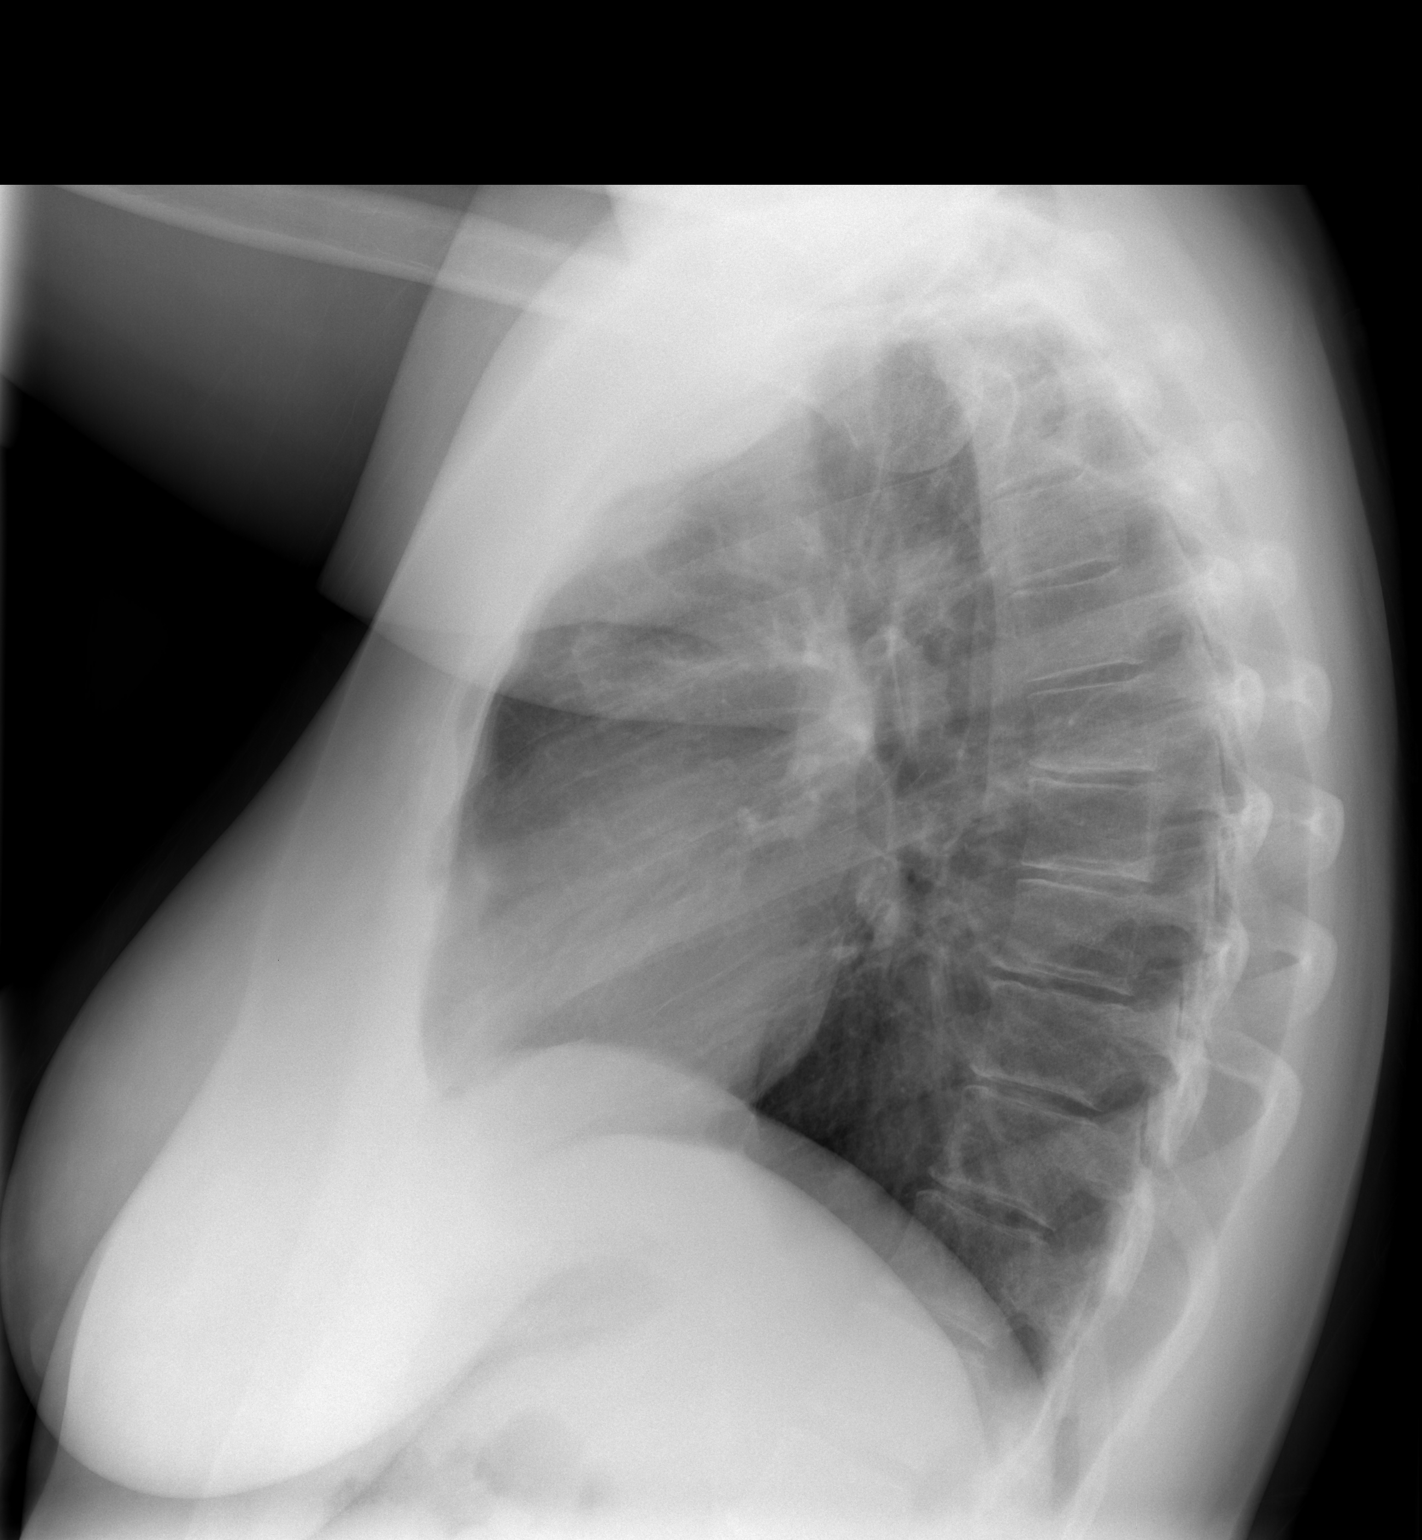

[2 of 2 positions shown; findings below may reference images not displayed]

FINDINGS: The heart is normal in size.  Lungs are clear.  No pneumothoraces or effusions are seen.  Bronchitic changes are moderate.  The thoracic spine is intact.
IMPRESSION: Bronchitic changes.

## 2009-11-01 ENCOUNTER — Telehealth: Payer: Self-pay | Admitting: Family Medicine

## 2010-04-02 ENCOUNTER — Telehealth: Payer: Self-pay | Admitting: Family Medicine

## 2010-04-03 ENCOUNTER — Ambulatory Visit: Payer: Self-pay | Admitting: Nurse Practitioner

## 2010-04-03 ENCOUNTER — Inpatient Hospital Stay (HOSPITAL_COMMUNITY): Admission: AD | Admit: 2010-04-03 | Discharge: 2010-04-03 | Payer: Self-pay | Admitting: Obstetrics & Gynecology

## 2010-11-16 ENCOUNTER — Encounter: Payer: Self-pay | Admitting: Family Medicine

## 2010-11-25 NOTE — Progress Notes (Signed)
  Phone Note Call from Patient   Caller: Patient Reason for Call: Privacy/Consent Authorization Summary of Call: Sore knot under arm x 2 days (axilla).  Today another knot on finger and right hip.  Since being 5pm one on arm and innter buttocks.  No fever, chills.  Never happened before.  Had abscess on vaginal area years ago that was I&D and pt given atbx.  Pt's husband was able to express the one in axilla.  Pt states she is in pain.  Advised coming to ER if pain is intolerable.  Advised that if she can take Tyelnol or Ibuprofen and wait to go to urgent care tomorrow they may I&D and give atx and pain meds.  Pt wants to wait until tomorrow.  Advised going to ER right away if fever >102, increasing boils, or intolerable pain. Initial call taken by: Elan Brainerd MD,  November 01, 2009 7:22 PM

## 2010-11-25 NOTE — Progress Notes (Signed)
Summary: Triage  Phone Note Call from Patient Call back at Work Phone [phone]   Summary of Call: Pt wants to speak with rn about abdominal pain. Initial call taken by: Haydee Salter,  May 25, 2008 9:09 AM  Follow-up for Phone Call        line busy Follow-up by: Golden Circle RN,  May 25, 2008 9:17 AM  Additional Follow-up for Phone Call Additional follow up Details #1::        left message Additional Follow-up by: Golden Circle RN,  May 25, 2008 9:49 AM    Additional Follow-up for Phone Call Additional follow up Details #2::    returning call Follow-up by: Haydee Salter,  May 25, 2008 9:57 AM  Additional Follow-up for Phone Call Additional follow up Details #3:: Details for Additional Follow-up Action Taken: had tubal done after birth of child last yr. now has lower abd pain & pain with sex. feels like she is having the worst cramps. no period x 3 months which is normal for her. very irreg periods. also constipated which she says is normal. unable to come in today. appt made with Dr. Deirdre Peer next Monday. pcp not available. taking aleve with some relief. told her to call md on call if needed or go to ed if worse over weekend. pt agreed with plan Additional Follow-up by: Golden Circle RN,  May 25, 2008 10:20 AM

## 2010-11-25 NOTE — Progress Notes (Signed)
  Phone Note Call from Patient   Caller: Patient Details for Reason: severe pelvic pain Summary of Call: Pt states she had sex with husband this pm, now having severe pelvic, lower abdominal pain. no bleeding , n/v, but pain persistant unable to sit , stand or walk without severe pain. History of BTL and endomethrial eblation, history of ovarian cyts. As unable to tolerate pain advised ER to be evaluated. ? Ovarian cyst rupture, cervical brusiing, PID, etc

## 2010-12-15 ENCOUNTER — Other Ambulatory Visit: Payer: Self-pay | Admitting: Gynecology

## 2010-12-23 ENCOUNTER — Ambulatory Visit (HOSPITAL_BASED_OUTPATIENT_CLINIC_OR_DEPARTMENT_OTHER)
Admission: RE | Admit: 2010-12-23 | Discharge: 2010-12-24 | Disposition: A | Discharge status: Home / Self Care | Payer: Managed Care, Other (non HMO) | Source: Ambulatory Visit | Attending: Gynecology | Admitting: Gynecology

## 2010-12-23 ENCOUNTER — Other Ambulatory Visit: Payer: Self-pay | Admitting: Gynecology

## 2010-12-23 DIAGNOSIS — N949 Unspecified condition associated with female genital organs and menstrual cycle: Secondary | ICD-10-CM | POA: Insufficient documentation

## 2010-12-23 DIAGNOSIS — N7013 Chronic salpingitis and oophoritis: Secondary | ICD-10-CM | POA: Insufficient documentation

## 2010-12-23 DIAGNOSIS — N8000 Endometriosis of the uterus, unspecified: Secondary | ICD-10-CM | POA: Insufficient documentation

## 2010-12-23 DIAGNOSIS — E669 Obesity, unspecified: Secondary | ICD-10-CM | POA: Insufficient documentation

## 2010-12-23 DIAGNOSIS — N736 Female pelvic peritoneal adhesions (postinfective): Secondary | ICD-10-CM | POA: Insufficient documentation

## 2010-12-23 DIAGNOSIS — E282 Polycystic ovarian syndrome: Secondary | ICD-10-CM | POA: Insufficient documentation

## 2010-12-23 LAB — POCT HEMOGLOBIN-HEMACUE: Hemoglobin: 13.9 g/dL (ref 12.0–15.0)

## 2010-12-23 LAB — HEMOGLOBIN AND HEMATOCRIT, BLOOD: Hemoglobin: 13.1 g/dL (ref 12.0–15.0)

## 2011-01-12 LAB — CBC
HCT: 35.8 % — ABNORMAL LOW (ref 36.0–46.0)
Hemoglobin: 12.6 g/dL (ref 12.0–15.0)
MCV: 91.5 fL (ref 78.0–100.0)
Platelets: 179 10*3/uL (ref 150–400)
WBC: 9.8 10*3/uL (ref 4.0–10.5)

## 2011-01-12 LAB — POCT PREGNANCY, URINE: Preg Test, Ur: NEGATIVE

## 2011-01-12 LAB — WET PREP, GENITAL: Trich, Wet Prep: NONE SEEN

## 2011-01-12 LAB — URINALYSIS, ROUTINE W REFLEX MICROSCOPIC
Bilirubin Urine: NEGATIVE
Hgb urine dipstick: NEGATIVE
Specific Gravity, Urine: 1.025 (ref 1.005–1.030)
pH: 7 (ref 5.0–8.0)

## 2011-01-12 LAB — URINE MICROSCOPIC-ADD ON

## 2011-01-12 LAB — GC/CHLAMYDIA PROBE AMP, GENITAL: Chlamydia, DNA Probe: NEGATIVE

## 2011-01-30 LAB — COMPREHENSIVE METABOLIC PANEL
ALT: 23 U/L (ref 0–35)
Albumin: 4.1 g/dL (ref 3.5–5.2)
Alkaline Phosphatase: 53 U/L (ref 39–117)
GFR calc Af Amer: >60 mL/min (ref >60)
Potassium: 4 mEq/L (ref 3.5–5.1)
Sodium: 138 mEq/L (ref 135–145)
Total Protein: 7.4 g/dL (ref 6.0–8.3)

## 2011-01-30 LAB — URINALYSIS, ROUTINE W REFLEX MICROSCOPIC
Bilirubin Urine: NEGATIVE
Hgb urine dipstick: NEGATIVE
Ketones, ur: NEGATIVE mg/dL
Specific Gravity, Urine: 1.022 (ref 1.005–1.030)
pH: 5.5 (ref 5.0–8.0)

## 2011-01-30 LAB — POCT PREGNANCY, URINE: Preg Test, Ur: NEGATIVE

## 2011-01-30 LAB — DIFFERENTIAL
Basophils Relative: 1 % (ref 0–1)
Eosinophils Absolute: 0.1 10*3/uL (ref 0.0–0.7)
Monocytes Absolute: 0.3 10*3/uL (ref 0.1–1.0)
Monocytes Relative: 7 % (ref 3–12)

## 2011-01-30 LAB — CBC
Platelets: 193 10*3/uL (ref 150–400)
RDW: 12.8 % (ref 11.5–15.5)

## 2011-02-03 LAB — DIFFERENTIAL
Eosinophils Absolute: 0 10*3/uL (ref 0.0–0.7)
Eosinophils Relative: 0 % (ref 0–5)
Lymphs Abs: 1.5 10*3/uL (ref 0.7–4.0)
Monocytes Absolute: 0.6 10*3/uL (ref 0.1–1.0)

## 2011-02-03 LAB — CBC
HCT: 40.7 % (ref 36.0–46.0)
Hemoglobin: 14.1 g/dL (ref 12.0–15.0)
MCV: 88.9 fL (ref 78.0–100.0)
Platelets: 188 10*3/uL (ref 150–400)
WBC: 8.4 10*3/uL (ref 4.0–10.5)

## 2011-02-03 LAB — MONONUCLEOSIS SCREEN: Mono Screen: NEGATIVE

## 2011-03-10 NOTE — Op Note (Signed)
Whitney Smith, Whitney Smith                ACCOUNT NO.:  [id number]   MEDICAL RECORD NO.:  [id number]          PATIENT TYPE:  INP   LOCATION:  9110                          FACILITY:  [facility]   PHYSICIAN:  Phil D. Okey Dupre, M.D.     DATE OF BIRTH:  1981/01/14   DATE OF PROCEDURE:  06/29/2007  DATE OF DISCHARGE:                               OPERATIVE REPORT   PREOPERATIVE DIAGNOSES:  1. Patient desiring permanent sterilization.  2. Postpartum day number 0 from a spontaneous vaginal delivery at 39      weeks 2 days.  3. Gravida 3, para 2-0-1-2.   POSTOPERATIVE DIAGNOSES:  1. Patient desiring permanent sterilization.  2. Postpartum day number 0 from a spontaneous vaginal delivery at 39      weeks 2 days.  3. Gravida 3, para 2-0-1-2.   PROCEDURE:  Bilateral tubal occlusion with Filshie clips.   SURGEON:  Javier Glazier. Okey Dupre, M.D.   ASSISTANT:  Karlton Lemon, M.D.   ANESTHESIA:  Spinal.   SPECIMENS:  None.   DRAINS:  Foley.   ESTIMATED BLOOD LOSS:  Less than 10 mL, minimal.   COMPLICATIONS:  None.   FINDINGS:  Normal female anatomy, uterus and tubes identified.   INDICATIONS FOR PROCEDURE:  This 30 year old gravida 3, para 2-0-1-2 is  requesting permanent sterilization with bilateral tubal occlusion.   DESCRIPTION OF PROCEDURE:  The patient was taken to the operating room,  where she was prepped and draped in normal sterile fashion.  Adequate  anesthesia was obtained.  An incision was made inferior to the umbilicus  in a curved fashion with the scalpel.  The incision was carried down  with the scalpel to the level of the fascia.  The fascia was then nicked  in the midline.  Dissection of the fascia with Metzenbaum scissors was  performed to adequately expose the peritoneal cavity.  The omentum and  uterus were identified.  The patient was placed in Trendelenburg with a  left lateral tilt.  A RayTec sponge was placed to displace the omentum  for visualization of the tube.  The patient's right  tube was identified  and clamped with Babcock clamp.  A second Babcock clamp was placed.  The  fimbria of the fallopian tube was identified.  A Filshie clip was placed  between the two clamps at the isthmus ampullary junction of the right  fallopian tube.  Babcocks were removed, and the tube was placed back  into the peritoneal cavity.  Attention was then turned to the left tube.  The patient was placed in the right lateral displacement.  The tube was  identified and clamped with Babcock clamps x2.  The fimbria of the  fallopian tube was identified.  A Filshie clip was clamped between two  Babcocks in the isthmus ampullary junction of the fallopian tube.  The  tube was then placed back in the peritoneal cavity.  The fascia was then  reapproximated with 0 Vicryl in a running fashion.  Subcutaneous tissue  was reapproximated with 0 Vicryl with a vertical mattress suture x1.  A  subcuticular closure  of the skin was performed.  The patient tolerated  the procedure well and went to the post-anesthesia care unit in stable  condition.      Karlton Lemon, MD  Electronically Signed     ______________________________  Javier Glazier. Okey Dupre, M.D.    NS/MEDQ  D:  06/29/2007  T:  06/29/2007  Job:  [id number]

## 2011-03-10 NOTE — Op Note (Signed)
Whitney Smith, DANN                ACCOUNT NO.:  [id number]   MEDICAL RECORD NO.:  [id number]          PATIENT TYPE:  AMB   LOCATION:  NESC                         FACILITY:  [facility]   PHYSICIAN:  Gretta Cool, M.D. DATE OF BIRTH:  12-02-1980   DATE OF PROCEDURE:  10/12/2008  DATE OF DISCHARGE:                               OPERATIVE REPORT   PREOPERATIVE DIAGNOSIS:  Abnormal uterine bleeding, severe menorrhagia,  unresponsive.   POSTOPERATIVE DIAGNOSIS:  Abnormal uterine bleeding, severe menorrhagia,  unresponsive.   PROCEDURE:  Hysteroscopy, resection, ablation total plus VaporTrode  ablation.   SURGEON:  Gretta Cool, M.D.   ANESTHESIA:  IV sedation and paracervical block.   DESCRIPTION OF PROCEDURE:  After anesthesia, as above, with the patient  prepped and draped in Allen stirrups, the bladder drained, a weighted  speculum is placed into the vagina, and the cervix pulled down into  view.  The cervix was then progressively dilated with a series of Pratt  dilators to a #33.  The 7-mm resectoscope was then introduced, and the  entire cavity examined.  There were no polyps or fibroids visible.  No  other anatomic evidence for reason for abnormal uterine bleeding.  The  cavity was very large, and endometrium well-thinned.  At this point, the  entire endometrial cavity was resected totally by 90-degree resectoscope  loop.  Resection was carried 5 mm or more into the myometrium through  all of the gland openings.  At the end of the resection, there was no  evidence of gland tissue remaining.  The corneal layers were then  treated by VaporTrode by touch technique.  The entire cavity was then  systemically treated by VaporTrode, so as to eliminate any superficial  islands of endometrium that could not be identified.  At this point,  once the entire cavity was treated, there was virtually no bleeding at  reduced pressure.  At this point, the patient was returned to the  recovery room in excellent condition.  Estimated deficit was  approximately 80 cc.   COMPLICATIONS:  None.           ______________________________  Gretta Cool, M.D.     CWL/MEDQ  D:  10/12/2008  T:  10/12/2008  Job:  [id number]   cc:   Dr. Marlane Hatcher [facility]
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
## 2011-04-10 NOTE — Op Note (Signed)
[name], Whitney Smith NO.:  [id number]  MEDICAL RECORD NO.:  [id number]          PATIENT TYPE:  LOCATION:                                 FACILITY:  PHYSICIAN:  Gretta Cool, M.D. DATE OF BIRTH:  07/02/1984  DATE OF PROCEDURE:  12/23/2010 DATE OF DISCHARGE:                              OPERATIVE REPORT   PREOPERATIVE DIAGNOSES: 1. Incapacitating increasingly severe cyclic pelvic pain. 2. History of the tubal sterilization and hysteroscopy resection     ablation.  POSTOPERATIVE DIAGNOSES: 1. Incapacitating increasingly severe cyclic pelvic pain. 2. History of the tubal sterilization and hysteroscopy resection     ablation. 3. Probable extensive adenomyosis.  SURGEON:  Gretta Cool, MD  ASSISTANT:  Luvenia Redden, MD  ANESTHESIA:  General orotracheal.  DESCRIPTION OF PROCEDURE:  Under excellent anesthesia as above with the patient prepped and draped in supine position with Foley catheter draining her bladder, a Pfannenstiel incision was made and the incision extended through the fascia.  The rectus muscle was then separated in the midline and the peritoneum opened.  There were no abnormalities identified in the upper abdomen.  Both ovaries appeared polycystic with smooth surface and multiple follicles but no evidence of recent ovulation.  Both ovaries were free of adhesions, scarring, endometriosis, or any other pelvic pathology.  There were Filshie clips applied to the distal fallopian tube.  The intervening segment of the fallopian tube between the Filshie clip and the uterus on both sides was hydrosalpinx.  At this point, __________ was packed away, retractors placed.  The uterus grasped with Kelly clamps and elevated.  The round ligaments were then transected by cautery.  The anterior leaf of the broad ligament was then opened and bladder pushed off the lower uterine segment.  The ovarian vessels on each side were then clamped,  cut, sutured and tied with 0 Vicryl.  A second free tie was used to doubly ligate the ovarian pedicle.  At this point, the uterine vessels skeletonized, clamped, cut, sutured and tied with 0 Vicryl.  The upper portion of cardinal ligaments were then also clamped, cut, sutured and tied with 0 Vicryl.  At this point, the cervix was incised in reverse cone fashion so as to remove much of the endocervical canal.  The remaining canal was treated by cautery.  At this point, the cervix was closed transversely so as to control bleeding completely.  At this point, the pelvic floor was reperitonealized as possible with 2-0 Monocryl.  At this point, the pelvis was irrigated to remove clot and debris.  The retractors were removed.  The omentum was pulled down over the abdominal incision.  The abdomen and peritoneum was then closed with a running suture of #0 Monocryl from the umbilicus to the symphysis. The rectus fascia was then approximated with a running suture from each angle of the midline with 0 Vicryl.  At this point, the subcutaneous tissues approximated with interrupted sutures of 3-0 Vicryl and skin closed with skin staples and Steri-Strips.  At the end of the procedure, sponge and lap counts were correct.  There were no complications.  The patient returned to recovery room in excellent condition.          ______________________________ Gretta Cool, M.D.     CWL/MEDQ  D:  12/23/2010  T:  12/23/2010  Job:  [id number]  cc:   William A. Leveda Anna, M.D.  Electronically Signed by Beather Arbour M.D. on 04/10/2011 11:48:45 AM

## 2011-07-27 ENCOUNTER — Ambulatory Visit
Admission: RE | Admit: 2011-07-27 | Discharge: 2011-07-27 | Disposition: A | Discharge status: Home / Self Care | Payer: Managed Care, Other (non HMO) | Source: Ambulatory Visit | Attending: Gynecology | Admitting: Gynecology

## 2011-07-27 ENCOUNTER — Other Ambulatory Visit: Payer: Self-pay | Admitting: Gynecology

## 2011-07-27 DIAGNOSIS — J069 Acute upper respiratory infection, unspecified: Secondary | ICD-10-CM

## 2011-08-07 LAB — CBC
HCT: 31.3 — ABNORMAL LOW
HCT: 36.1
Hemoglobin: 11.2 — ABNORMAL LOW
Hemoglobin: 12.8
MCHC: 35.4
MCHC: 35.7
MCV: 91.1
MCV: 91.8
Platelets: 178
RBC: 3.96
RDW: 13.9

## 2011-08-07 LAB — RPR: RPR Ser Ql: NONREACTIVE

## 2012-07-29 ENCOUNTER — Other Ambulatory Visit: Payer: Self-pay | Admitting: Gynecology

## 2012-07-29 DIAGNOSIS — Z1231 Encounter for screening mammogram for malignant neoplasm of breast: Secondary | ICD-10-CM

## 2012-08-01 ENCOUNTER — Ambulatory Visit
Admission: RE | Admit: 2012-08-01 | Discharge: 2012-08-01 | Disposition: A | Discharge status: Home / Self Care | Payer: Managed Care, Other (non HMO) | Source: Ambulatory Visit | Attending: Gynecology | Admitting: Gynecology

## 2012-08-01 DIAGNOSIS — Z1231 Encounter for screening mammogram for malignant neoplasm of breast: Secondary | ICD-10-CM

## 2012-08-04 ENCOUNTER — Ambulatory Visit: Payer: Managed Care, Other (non HMO)

## 2012-12-12 ENCOUNTER — Other Ambulatory Visit: Payer: Self-pay | Admitting: Gynecology

## 2013-07-20 ENCOUNTER — Inpatient Hospital Stay (HOSPITAL_COMMUNITY)
Admission: AD | Admit: 2013-07-20 | Discharge: 2013-07-20 | Disposition: A | Discharge status: Home / Self Care | Payer: Managed Care, Other (non HMO) | Source: Ambulatory Visit | Attending: Obstetrics and Gynecology | Admitting: Obstetrics and Gynecology

## 2013-07-20 ENCOUNTER — Encounter (HOSPITAL_COMMUNITY): Payer: Self-pay | Admitting: *Deleted

## 2013-07-20 ENCOUNTER — Inpatient Hospital Stay (HOSPITAL_COMMUNITY): Payer: Managed Care, Other (non HMO)

## 2013-07-20 DIAGNOSIS — N83201 Unspecified ovarian cyst, right side: Secondary | ICD-10-CM

## 2013-07-20 DIAGNOSIS — N949 Unspecified condition associated with female genital organs and menstrual cycle: Secondary | ICD-10-CM

## 2013-07-20 DIAGNOSIS — M545 Low back pain, unspecified: Secondary | ICD-10-CM | POA: Insufficient documentation

## 2013-07-20 DIAGNOSIS — N9489 Other specified conditions associated with female genital organs and menstrual cycle: Secondary | ICD-10-CM | POA: Insufficient documentation

## 2013-07-20 DIAGNOSIS — R109 Unspecified abdominal pain: Secondary | ICD-10-CM | POA: Insufficient documentation

## 2013-07-20 DIAGNOSIS — R300 Dysuria: Secondary | ICD-10-CM | POA: Insufficient documentation

## 2013-07-20 HISTORY — DX: Polycystic ovarian syndrome: E28.2

## 2013-07-20 HISTORY — DX: Endometriosis, unspecified: N80.9

## 2013-07-20 HISTORY — DX: Headache: R51

## 2013-07-20 HISTORY — DX: Renal tubulo-interstitial disease, unspecified: N15.9

## 2013-07-20 LAB — COMPREHENSIVE METABOLIC PANEL
AST: 18 U/L (ref 0–37)
Alkaline Phosphatase: 68 U/L (ref 39–117)
BUN: 13 mg/dL (ref 6–23)
CO2: 23 mEq/L (ref 19–32)
Calcium: 9.1 mg/dL (ref 8.4–10.5)
Chloride: 103 mEq/L (ref 96–112)
Creatinine, Ser: 0.55 mg/dL (ref 0.50–1.10)
GFR calc Af Amer: >90 mL/min (ref >90)
GFR calc non Af Amer: >90 mL/min (ref >90)

## 2013-07-20 LAB — CBC
HCT: 37.5 % (ref 36.0–46.0)
Hemoglobin: 13.2 g/dL (ref 12.0–15.0)
MCH: 30.2 pg (ref 26.0–34.0)
MCHC: 35.2 g/dL (ref 30.0–36.0)
MCV: 85.8 fL (ref 78.0–100.0)
Platelets: 219 K/uL (ref 150–400)
RBC: 4.37 MIL/uL (ref 3.87–5.11)
RDW: 13.2 % (ref 11.5–15.5)
WBC: 7.6 K/uL (ref 4.0–10.5)

## 2013-07-20 LAB — URINALYSIS, ROUTINE W REFLEX MICROSCOPIC
Bilirubin Urine: NEGATIVE
Hgb urine dipstick: NEGATIVE
Nitrite: NEGATIVE
Protein, ur: NEGATIVE mg/dL
Specific Gravity, Urine: >1.03 — ABNORMAL HIGH (ref 1.005–1.030)
Urobilinogen, UA: 0.2 mg/dL (ref 0.0–1.0)

## 2013-07-20 LAB — AMYLASE: Amylase: 47 U/L (ref 0–105)

## 2013-07-20 LAB — LIPASE, BLOOD: Lipase: 21 U/L (ref 11–59)

## 2013-07-20 MED ORDER — HYDROCODONE-ACETAMINOPHEN 5-325 MG PO TABS: 2.0000 | ORAL_TABLET | ORAL | Status: DC | PRN

## 2013-07-20 MED ORDER — IBUPROFEN 800 MG PO TABS: 800.0000 mg | ORAL_TABLET | Freq: Three times a day (TID) | ORAL | Status: DC

## 2013-07-20 MED ORDER — KETOROLAC TROMETHAMINE 60 MG/2ML IM SOLN
60.0000 mg | Freq: Once | INTRAMUSCULAR | Status: AC
  Administered 2013-07-20: 60 mg via INTRAMUSCULAR
  Filled 2013-07-20: qty 2

## 2013-07-20 NOTE — MAU Note (Signed)
C/o right-sided lower back pain that increases with voiding; has been having pain for past month and she has been on 2 different kind of ATBs for kidney infection; has been taking Ibuprofen with no relief;

## 2013-07-20 NOTE — MAU Note (Signed)
Patient states she has a history of recurrent kidney infections, has been treated twice over the past month and responded well. States the pain and burning with urination started about 3 days ago with intense right flank pain.

## 2013-07-20 NOTE — MAU Provider Note (Signed)
History     CSN: [id number]  Arrival date and time: 07/20/13 1148   First Provider Initiated Contact with Patient 07/20/13 1239      Chief Complaint  Patient presents with  . Flank Pain   HPI Pt is 32 yo not pregnant with hx TAH for endometriosis and adenomyosis.  Pt has PCOS and has been on  Metformin.  Pt presents with sudden onset of RLQ pain that radiates to her back.  Pt thinks she had a fever yesterday with temp ?101. Pt has taken Ibuprofen without relief.  Pt had possible kidney infection treated with 2 different antibiotics from PCP/urgent care RN note: Garvin Fila, RN Registered Nurse Signed  MAU Note Service date: 07/20/2013 11:57 AM   Patient states she has a history of recurrent kidney infections, has been treated twice over the past month and responded well. States the pain and burning with urination started about 3 days ago with intense right flank pain  C/o right-sided lower back pain that increases with voiding; has been having pain for past month and she has been on 2 different kind of ATBs for kidney infection; has been taking Ibuprofen with no relief;    Past Medical History  Diagnosis Date  . Headache(784.0)   . Endometriosis   . PCOS (polycystic ovarian syndrome)   . Kidney infection     Past Surgical History  Procedure Laterality Date  . Tubal ligation    . Abdominal hysterectomy    . Endometrial ablation      Family History  Problem Relation Age of Onset  . Cancer Mother   . Diabetes Father   . Diabetes Maternal Grandmother   . Cancer Maternal Grandmother     History  Substance Use Topics  . Smoking status: Never Smoker   . Smokeless tobacco: Not on file  . Alcohol Use: No    Allergies:  Allergies  Allergen Reactions  . Penicillins Anaphylaxis and Shortness Of Breath  . Sulfamethoxazole Anaphylaxis and Shortness Of Breath  . Codeine Phosphate Hives    Prescriptions prior to admission  Medication Sig Dispense Refill  .  acyclovir (ZOVIRAX) 200 MG capsule Take 200 mg by mouth 3 (three) times daily as needed (for cold sore or fever blister outbreaks).       . ALPRAZolam (XANAX) 0.5 MG tablet Take 0.5 mg by mouth at bedtime as needed for sleep.      . metFORMIN (GLUMETZA) 500 MG (MOD) 24 hr tablet Take 500 mg by mouth 2 (two) times daily with a meal.        Review of Systems  Constitutional: Negative for chills.       ?temp 101 yesterday  Gastrointestinal: Positive for abdominal pain. Negative for nausea, vomiting, diarrhea and constipation.  Genitourinary: Negative for dysuria and urgency.  Musculoskeletal: Positive for back pain.   Physical Exam   Blood pressure 150/84, pulse 88, temperature 97.9 F (36.6 C), temperature source Oral, resp. rate 20, height 5' 7.5" (1.715 m), weight 124.195 kg (273 lb 12.8 oz), SpO2 100.00%.  Physical Exam  Nursing note and vitals reviewed. Constitutional: She is oriented to person, place, and time. She appears well-developed and well-nourished.  Uncomfortable appearing  HENT:  Head: Normocephalic.  Eyes: Pupils are equal, round, and reactive to light.  Neck: Normal range of motion. Neck supple.  Cardiovascular: Normal rate.   Respiratory: Effort normal.  GI: Soft. There is tenderness. There is guarding. There is no rebound.  RLQ tenderness; no CVA  tenderness  Musculoskeletal: Normal range of motion.  Neurological: She is alert and oriented to person, place, and time.  Skin: Skin is warm and dry.  Psychiatric: She has a normal mood and affect.    MAU Course  Procedures Results for orders placed during the hospital encounter of 07/20/13 (from the past 24 hour(s))  URINALYSIS, ROUTINE W REFLEX MICROSCOPIC     Status: Abnormal   Collection Time    07/20/13 12:05 PM      Result Value Range   Color, Urine YELLOW  YELLOW   APPearance CLEAR  CLEAR   Specific Gravity, Urine >1.030 (*) 1.005 - 1.030   pH 6.0  5.0 - 8.0   Glucose, UA NEGATIVE  NEGATIVE mg/dL   Hgb  urine dipstick NEGATIVE  NEGATIVE   Bilirubin Urine NEGATIVE  NEGATIVE   Ketones, ur NEGATIVE  NEGATIVE mg/dL   Protein, ur NEGATIVE  NEGATIVE mg/dL   Urobilinogen, UA 0.2  0.0 - 1.0 mg/dL   Nitrite NEGATIVE  NEGATIVE   Leukocytes, UA NEGATIVE  NEGATIVE  CBC     Status: None   Collection Time    07/20/13 12:48 PM      Result Value Range   WBC 7.6  4.0 - 10.5 K/uL   RBC 4.37  3.87 - 5.11 MIL/uL   Hemoglobin 13.2  12.0 - 15.0 g/dL   HCT 16.1  09.6 - 04.5 %   MCV 85.8  78.0 - 100.0 fL   MCH 30.2  26.0 - 34.0 pg   MCHC 35.2  30.0 - 36.0 g/dL   RDW 40.9  81.1 - 91.4 %   Platelets 219  150 - 400 K/uL  COMPREHENSIVE METABOLIC PANEL     Status: None   Collection Time    07/20/13 12:48 PM      Result Value Range   Sodium 137  135 - 145 mEq/L   Potassium 3.7  3.5 - 5.1 mEq/L   Chloride 103  96 - 112 mEq/L   CO2 23  19 - 32 mEq/L   Glucose, Bld 79  70 - 99 mg/dL   BUN 13  6 - 23 mg/dL   Creatinine, Ser 7.82  0.50 - 1.10 mg/dL   Calcium 9.1  8.4 - 95.6 mg/dL   Total Protein 6.9  6.0 - 8.3 g/dL   Albumin 4.3  3.5 - 5.2 g/dL   AST 18  0 - 37 U/L   ALT 28  0 - 35 U/L   Alkaline Phosphatase 68  39 - 117 U/L   Total Bilirubin 0.3  0.3 - 1.2 mg/dL   GFR calc non Af Amer >90  >90 mL/min   GFR calc Af Amer >90  >90 mL/min  AMYLASE     Status: None   Collection Time    07/20/13 12:48 PM      Result Value Range   Amylase 47  0 - 105 U/L  LIPASE, BLOOD     Status: None   Collection Time    07/20/13 12:48 PM      Result Value Range   Lipase 21  11 - 59 U/L   US Abdomen Complete  07/20/2013   CLINICAL DATA:  Flank pain and nausea  EXAM: ULTRASOUND ABDOMEN COMPLETE  COMPARISON:  None.  FINDINGS: Gallbladder  No gallstones or wall thickening. There is no pericholecystic fluid. Negative sonographic Murphy's sign.  Common bile duct  Diameter: 4 mm. There is no intrahepatic, common hepatic, or common bile duct  dilatation.  Liver  No focal lesion identified. Within normal limits in parenchymal  echogenicity.  IVC  No abnormality visualized.  Pancreas  No lesions identified.  Spleen  Size and appearance within normal limits.  Right Kidney  Length: 13.1 cm. Echogenicity within normal limits. No mass or hydronephrosis visualized.  Left Kidney  Length: 11.2 cm. Echogenicity within normal limits. No mass or hydronephrosis visualized.  Abdominal aorta  No aneurysm visualized.  IMPRESSION: No abnormality noted.   Electronically Signed   By: Bretta Bang   On: 07/20/2013 14:40   Discussed with Dr. Jackelyn Knife- will have pt follow up in office for repeat ultrasound in 2 to 3 months- sooner if increase in pain Assessment and Plan  Right ovarian cyst/mass- pain management RX for Ibuprofen 800mg  and Vicodin#18 F/u in office for repeat ultrasound 2 to3 months- sooner if pain increases, chills, fever Norton Bivins 07/20/2013, 12:40 PM

## 2013-10-21 ENCOUNTER — Encounter (HOSPITAL_COMMUNITY): Payer: Self-pay | Admitting: Family

## 2013-10-21 ENCOUNTER — Inpatient Hospital Stay (HOSPITAL_COMMUNITY)
Admission: AD | Admit: 2013-10-21 | Discharge: 2013-10-21 | Disposition: A | Discharge status: Home / Self Care | Payer: Managed Care, Other (non HMO) | Source: Ambulatory Visit | Attending: Obstetrics and Gynecology | Admitting: Obstetrics and Gynecology

## 2013-10-21 DIAGNOSIS — N898 Other specified noninflammatory disorders of vagina: Secondary | ICD-10-CM

## 2013-10-21 DIAGNOSIS — Z90710 Acquired absence of both cervix and uterus: Secondary | ICD-10-CM | POA: Insufficient documentation

## 2013-10-21 DIAGNOSIS — N899 Noninflammatory disorder of vagina, unspecified: Secondary | ICD-10-CM | POA: Insufficient documentation

## 2013-10-21 DIAGNOSIS — R109 Unspecified abdominal pain: Secondary | ICD-10-CM

## 2013-10-21 DIAGNOSIS — N83209 Unspecified ovarian cyst, unspecified side: Secondary | ICD-10-CM | POA: Insufficient documentation

## 2013-10-21 LAB — URINALYSIS, ROUTINE W REFLEX MICROSCOPIC
Bilirubin Urine: NEGATIVE
Hgb urine dipstick: NEGATIVE
Ketones, ur: NEGATIVE mg/dL
Leukocytes, UA: NEGATIVE
Nitrite: NEGATIVE
Protein, ur: NEGATIVE mg/dL
Specific Gravity, Urine: 1.025 (ref 1.005–1.030)
Urobilinogen, UA: 0.2 mg/dL (ref 0.0–1.0)
pH: 6 (ref 5.0–8.0)

## 2013-10-21 LAB — CBC
MCH: 30.9 pg (ref 26.0–34.0)
MCHC: 35.7 g/dL (ref 30.0–36.0)
MCV: 86.6 fL (ref 78.0–100.0)
Platelets: 194 10*3/uL (ref 150–400)
RBC: 4.76 MIL/uL (ref 3.87–5.11)
RDW: 13.4 % (ref 11.5–15.5)

## 2013-10-21 LAB — WET PREP, GENITAL
Trich, Wet Prep: NONE SEEN
Yeast Wet Prep HPF POC: NONE SEEN

## 2013-10-21 NOTE — MAU Note (Signed)
[facility] for r sided abd/flank pain radiating into right portion of pelvis. Thought she had kidney infection however was told u/a was normal except pH was slightly elevated. Was told she had a "chocolate cyst" a few months ago, and to return in 4-6 weeks as needed. Didn't start having pain until now. Hysterectomy 5 years ago, ovaries remain.

## 2013-10-21 NOTE — MAU Provider Note (Signed)
History     CSN: [id number]  Arrival date and time: 10/21/13 8413   First Provider Initiated Contact with Patient 10/21/13 [phone]      Chief Complaint  Patient presents with  . Abdominal Pain   HPI  [name] is a 32 y.o. non- pregnant female [id number] who presents with abdominal discomfort. She has a history of ovarian cysts in the past; last time she was here in September US showed a right ovarian cyst. She did follow up in Dr. Milana Kidney office following her visit in September, however could not get in next week to see them regarding this discomfort. She has not had a follow up US in the office. She is not having pain, however she is just uncomfortable in her right side of her abdomen and her right flank. The pain seems to exacerbate following intercourse.  She is also concerned with some vaginal irritation from shaving that she would like me to look at.  She currently rates her pain 2/10 and would not like anything for pain.   OB History   Grav Para Term Preterm Abortions TAB SAB Ect Mult Living   3 2 2  1  1   2       Past Medical History  Diagnosis Date  . Headache(784.0)   . Endometriosis   . PCOS (polycystic ovarian syndrome)   . Kidney infection     Past Surgical History  Procedure Laterality Date  . Tubal ligation    . Abdominal hysterectomy    . Endometrial ablation      Family History  Problem Relation Age of Onset  . Cancer Mother   . Diabetes Father   . Diabetes Maternal Grandmother   . Cancer Maternal Grandmother     History  Substance Use Topics  . Smoking status: Never Smoker   . Smokeless tobacco: Not on file  . Alcohol Use: No    Allergies:  Allergies  Allergen Reactions  . Penicillins Anaphylaxis and Shortness Of Breath  . Sulfamethoxazole Anaphylaxis and Shortness Of Breath  . Codeine Phosphate Hives    Prescriptions prior to admission  Medication Sig Dispense Refill  . acyclovir (ZOVIRAX) 200 MG capsule Take 200 mg by mouth 3  (three) times daily as needed (for cold sore or fever blister outbreaks).       . ALPRAZolam (XANAX) 0.5 MG tablet Take 0.5 mg by mouth at bedtime as needed for sleep.      Marland Kitchen HYDROcodone-acetaminophen (NORCO/VICODIN) 5-325 MG per tablet Take 2 tablets by mouth every 4 (four) hours as needed for pain.  18 tablet  0  . ibuprofen (ADVIL,MOTRIN) 800 MG tablet Take 1 tablet (800 mg total) by mouth 3 (three) times daily.  21 tablet  0  . metFORMIN (GLUMETZA) 500 MG (MOD) 24 hr tablet Take 500 mg by mouth 2 (two) times daily with a meal.        Results for orders placed during the hospital encounter of 10/21/13 (from the past 24 hour(s))  URINALYSIS, ROUTINE W REFLEX MICROSCOPIC     Status: None   Collection Time    10/21/13  9:16 AM      Result Value Range   Color, Urine YELLOW  YELLOW   APPearance CLEAR  CLEAR   Specific Gravity, Urine 1.025  1.005 - 1.030   pH 6.0  5.0 - 8.0   Glucose, UA NEGATIVE  NEGATIVE mg/dL   Hgb urine dipstick NEGATIVE  NEGATIVE   Bilirubin Urine NEGATIVE  NEGATIVE   Ketones, ur NEGATIVE  NEGATIVE mg/dL   Protein, ur NEGATIVE  NEGATIVE mg/dL   Urobilinogen, UA 0.2  0.0 - 1.0 mg/dL   Nitrite NEGATIVE  NEGATIVE   Leukocytes, UA NEGATIVE  NEGATIVE  WET PREP, GENITAL     Status: Abnormal   Collection Time    10/21/13  9:46 AM      Result Value Range   Yeast Wet Prep HPF POC NONE SEEN  NONE SEEN   Trich, Wet Prep NONE SEEN  NONE SEEN   Clue Cells Wet Prep HPF POC NONE SEEN  NONE SEEN   WBC, Wet Prep HPF POC FEW (*) NONE SEEN  CBC     Status: None   Collection Time    10/21/13  9:53 AM      Result Value Range   WBC 5.9  4.0 - 10.5 K/uL   RBC 4.76  3.87 - 5.11 MIL/uL   Hemoglobin 14.7  12.0 - 15.0 g/dL   HCT 16.1  09.6 - 04.5 %   MCV 86.6  78.0 - 100.0 fL   MCH 30.9  26.0 - 34.0 pg   MCHC 35.7  30.0 - 36.0 g/dL   RDW 40.9  81.1 - 91.4 %   Platelets 194  150 - 400 K/uL    Review of Systems  Constitutional: Negative for fever and chills.  Gastrointestinal:  Positive for abdominal pain. Negative for nausea, vomiting, diarrhea and constipation.       Lower right abdominal discomfort.   Genitourinary: Positive for frequency and flank pain. Negative for dysuria.       + right flank pain   Physical Exam   Blood pressure 150/75, pulse 80, temperature 98.3 F (36.8 C), temperature source Oral, resp. rate 18, height 5\' 7"  (1.702 m), weight 109.997 kg (242 lb 8 oz).  Physical Exam  Constitutional: She is oriented to person, place, and time. She appears well-developed and well-nourished. No distress.  HENT:  Head: Normocephalic.  Eyes: Pupils are equal, round, and reactive to light.  Neck: Neck supple.  Respiratory: Effort normal.  GI: Soft. There is tenderness in the suprapubic area and left lower quadrant. There is no rigidity, no rebound, no guarding, no CVA tenderness and no tenderness at McBurney's point.  Genitourinary: There is no rash or lesion on the right labia. There is no rash or lesion on the left labia.  Speculum exam: Vagina - Small amount of creamy, yellow discharge, no odor Cervix - No contact bleeding Bimanual exam: Cervix closed, no CMT  Uterus non tender, normal size Adnexa non tender, no masses bilaterally, + suprapubic tenderness  GC/Chlam, wet prep done Chaperone present for exam.   Neurological: She is alert and oriented to person, place, and time.  Skin: Skin is warm. She is not diaphoretic.  Psychiatric: Her behavior is normal.    MAU Course  Procedures None  MDM Pt denies the need for pain medication at this time.  Consulted with Dr. Jackelyn Knife; He would like for the patient to call the office on Monday to schedule a follow up visit in his office.    Assessment and Plan   A: 1. ABDOMINAL PAIN, LOWER   2. Vaginal irritation     P: Discharge home in stable condition  Ok to take ibuprofen or tylenol as directed on the bottle Call Dr. Junita Push office on Monday to schedule a follow up appointment Return  to MAU as needed, if symptoms worsen  Whitney Smith Whitney Smith 10/21/2013,  9:33 AM

## 2013-10-21 NOTE — MAU Note (Signed)
32 yo, G3P2 hx of hysterectomy, presents to MAU with c/o R flank pain and RLQ pain since Monday. Reports hx significant for kidney infections and also has R ovarian cyst. Denies fever, chills, N/V. Has not taken anything for pain. Denies vaginal discharge changes, burning or pain with urination. Reports pain increased after intercourse.

## 2013-10-23 LAB — GC/CHLAMYDIA PROBE AMP: GC Probe RNA: NEGATIVE

## 2013-10-27 ENCOUNTER — Telehealth (HOSPITAL_COMMUNITY): Payer: Self-pay | Admitting: *Deleted

## 2013-11-22 ENCOUNTER — Ambulatory Visit (INDEPENDENT_AMBULATORY_CARE_PROVIDER_SITE_OTHER): Payer: Managed Care, Other (non HMO) | Admitting: Family Medicine

## 2013-11-22 ENCOUNTER — Encounter: Payer: Self-pay | Admitting: Family Medicine

## 2013-11-22 VITALS — BP 136/80 | HR 80 | Temp 99.3°F | Ht 67.0 in | Wt 244.1 lb

## 2013-11-22 DIAGNOSIS — Z0000 Encounter for general adult medical examination without abnormal findings: Secondary | ICD-10-CM

## 2013-11-22 DIAGNOSIS — E669 Obesity, unspecified: Secondary | ICD-10-CM

## 2013-11-22 DIAGNOSIS — B351 Tinea unguium: Secondary | ICD-10-CM

## 2013-11-22 DIAGNOSIS — B009 Herpesviral infection, unspecified: Secondary | ICD-10-CM

## 2013-11-22 DIAGNOSIS — N809 Endometriosis, unspecified: Secondary | ICD-10-CM

## 2013-11-22 DIAGNOSIS — G4700 Insomnia, unspecified: Secondary | ICD-10-CM | POA: Insufficient documentation

## 2013-11-22 DIAGNOSIS — Z13220 Encounter for screening for lipoid disorders: Secondary | ICD-10-CM

## 2013-11-22 DIAGNOSIS — N644 Mastodynia: Secondary | ICD-10-CM

## 2013-11-22 DIAGNOSIS — Z90710 Acquired absence of both cervix and uterus: Secondary | ICD-10-CM

## 2013-11-22 MED ORDER — ACYCLOVIR 200 MG PO CAPS: 200.0000 mg | ORAL_CAPSULE | Freq: Three times a day (TID) | ORAL | Status: DC | PRN

## 2013-11-22 MED ORDER — ALPRAZOLAM 0.5 MG PO TABS: 0.5000 mg | ORAL_TABLET | Freq: Every evening | ORAL | Status: DC | PRN

## 2013-11-22 MED ORDER — TERBINAFINE HCL 250 MG PO TABS: 250.0000 mg | ORAL_TABLET | Freq: Every day | ORAL | Status: DC

## 2013-11-22 NOTE — Patient Instructions (Signed)
I will call next week with cholesterol  Results and endometriosis options.   I sent three prescriptions.   Your acyclovir and alprazolam.  Plus a new prescription for the fungus nail.

## 2013-11-22 NOTE — Assessment & Plan Note (Addendum)
Mild. Refill alprazolam

## 2013-11-22 NOTE — Assessment & Plan Note (Signed)
Discussed and will treat

## 2013-11-22 NOTE — Assessment & Plan Note (Signed)
She is working on it

## 2013-11-22 NOTE — Assessment & Plan Note (Signed)
Reassure and observe.  Diagnositc mammo if grows.

## 2013-11-22 NOTE — Progress Notes (Signed)
   Subjective:    Patient ID: Whitney Smith, female    DOB: 09-Feb-1981, 33 y.o.   MRN: [id number]  HPI  Annual physical.  Not seem by us since 2011.  Not seen by me since 2009. Issues. 1. Had hysterectomy.  Ovaries in situ.  Dxed with endometriosis operatively.  Still with pain.  Was very satisfied with care by [facility].  Not getting along well with [facility]. 2. Toenail problem Left great toe 3. Obesity.  Her weight had ballooned up but now working with diet and Systems analystpersonal trainer.  Good progress over past 6 months.   4. Concern of left breast lump. HPDP, no need for paps.  Has not had cholesterol in > 5 years.     Review of Systems     Objective:   Physical ExamHEENT normal Neck supple without thyromegally Lungs clear Breast no dominent mass.  The concern area was a bb sized lump with she had several others scattered throughout the breast. Heart RRR without m or g Abd benign        Assessment & Plan:

## 2013-11-22 NOTE — Assessment & Plan Note (Signed)
Generally healthy.  Needs cholesterol check.  Major problem is obesity and she is working diligently on life style changes.

## 2014-06-24 ENCOUNTER — Inpatient Hospital Stay (HOSPITAL_COMMUNITY)
Admission: AD | Admit: 2014-06-24 | Discharge: 2014-06-24 | Disposition: A | Discharge status: Home / Self Care | Payer: Managed Care, Other (non HMO) | Source: Ambulatory Visit | Attending: Obstetrics and Gynecology | Admitting: Obstetrics and Gynecology

## 2014-06-24 ENCOUNTER — Inpatient Hospital Stay (HOSPITAL_COMMUNITY): Payer: Managed Care, Other (non HMO)

## 2014-06-24 ENCOUNTER — Encounter (HOSPITAL_COMMUNITY): Payer: Self-pay

## 2014-06-24 DIAGNOSIS — N838 Other noninflammatory disorders of ovary, fallopian tube and broad ligament: Secondary | ICD-10-CM

## 2014-06-24 DIAGNOSIS — N83209 Unspecified ovarian cyst, unspecified side: Secondary | ICD-10-CM | POA: Insufficient documentation

## 2014-06-24 DIAGNOSIS — Z90711 Acquired absence of uterus with remaining cervical stump: Secondary | ICD-10-CM | POA: Diagnosis not present

## 2014-06-24 DIAGNOSIS — N839 Noninflammatory disorder of ovary, fallopian tube and broad ligament, unspecified: Secondary | ICD-10-CM

## 2014-06-24 DIAGNOSIS — R1031 Right lower quadrant pain: Secondary | ICD-10-CM | POA: Insufficient documentation

## 2014-06-24 LAB — CBC
HCT: 37.1 % (ref 36.0–46.0)
Hemoglobin: 13.2 g/dL (ref 12.0–15.0)
MCH: 31.2 pg (ref 26.0–34.0)
MCHC: 35.6 g/dL (ref 30.0–36.0)
MCV: 87.7 fL (ref 78.0–100.0)
PLATELETS: 196 10*3/uL (ref 150–400)
RBC: 4.23 MIL/uL (ref 3.87–5.11)
RDW: 13 % (ref 11.5–15.5)
WBC: 6.4 10*3/uL (ref 4.0–10.5)

## 2014-06-24 LAB — URINALYSIS, ROUTINE W REFLEX MICROSCOPIC
Bilirubin Urine: NEGATIVE
GLUCOSE, UA: NEGATIVE mg/dL
Hgb urine dipstick: NEGATIVE
KETONES UR: NEGATIVE mg/dL
Nitrite: NEGATIVE
PROTEIN: NEGATIVE mg/dL
Specific Gravity, Urine: 1.02 (ref 1.005–1.030)
Urobilinogen, UA: 0.2 mg/dL (ref 0.0–1.0)
pH: 6 (ref 5.0–8.0)

## 2014-06-24 LAB — WET PREP, GENITAL
Trich, Wet Prep: NONE SEEN
YEAST WET PREP: NONE SEEN

## 2014-06-24 LAB — URINE MICROSCOPIC-ADD ON

## 2014-06-24 MED ORDER — PROMETHAZINE HCL 25 MG PO TABS: 25.0000 mg | ORAL_TABLET | Freq: Four times a day (QID) | ORAL | Status: DC | PRN

## 2014-06-24 MED ORDER — IBUPROFEN 800 MG PO TABS: 800.0000 mg | ORAL_TABLET | Freq: Three times a day (TID) | ORAL | Status: DC

## 2014-06-24 MED ORDER — KETOROLAC TROMETHAMINE 60 MG/2ML IM SOLN
60.0000 mg | Freq: Once | INTRAMUSCULAR | Status: AC
  Administered 2014-06-24: 60 mg via INTRAMUSCULAR
  Filled 2014-06-24: qty 2

## 2014-06-24 MED ORDER — OXYCODONE-ACETAMINOPHEN 5-325 MG PO TABS: 2.0000 | ORAL_TABLET | ORAL | Status: DC | PRN

## 2014-06-24 NOTE — Discharge Instructions (Signed)
Ovarian Cystectomy, Care After  Refer to this sheet in the next few weeks. These instructions provide you with information on caring for yourself after your procedure. Your health care provider may also give you more specific instructions. Your treatment has been planned according to current medical practices, but problems sometimes occur. Call your health care provider if you have any problems or questions after your procedure.   WHAT TO EXPECT AFTER THE PROCEDURE  After your procedure, it is typical to have the following:  · Pain in your abdomen, especially at the incision sites. You will be given pain medicines to control the pain.  · Tiredness. This is a normal part of the recovery process. Your energy level will return to normal over the next several weeks.  · Constipation.  HOME CARE INSTRUCTIONS  · Only take over-the-counter or prescription medicines as directed by your health care provider. Avoid taking aspirin because it can cause bleeding.    · Follow your health care provider's instructions for when to resume your regular diet, exercise, and activities.    · Take rest breaks during the day as needed.  · Do not douche or have sexual intercourse until you have permission from your health care provider.    · Remove or change any bandages (dressings) as directed by your health care provider.    · Do not drive until your health care provider approves.    · Take showers instead of baths until your health care provider tells you otherwise.    · If you become constipated, you may:    ¨ Use a mild laxative if your health care provider approves.  ¨ Add more fruit and bran to your diet.  ¨ Drink more fluids.  · Take your temperature twice a day and record it.    · Do not drink alcohol while taking pain medicine.    · Try to have someone home with you for the first 1-2 weeks to help with your household activities.    · Follow up with your health care provider as directed.  SEEK MEDICAL CARE IF:  · You have a fever.     · You feel sick to your stomach (nauseous) and throw up (vomit).    · You have redness, swelling, or leakage of fluid at the incision site.    · You have pain when you urinate or have blood in your urine.    · You have a rash on your body.    · You have pain or redness where the IV tube was inserted.    · You have pain that is not relieved with medicine.    SEEK IMMEDIATE MEDICAL CARE IF:  · You have chest pain or shortness of breath.    · You feel dizzy or lightheaded.    · You have increasing abdominal pain that is not relieved with medicines.    · You have pain, swelling, or redness in your leg.    · You see a yellowish white fluid (pus) coming from the incision.    · Your incision is opening (edges not staying together).    Document Released: 08/02/2013 Document Reviewed: 08/02/2013  Exit[employer]® Patient Information ©2015 [employer]. This information is not intended to replace advice given to you by your health care provider. Make sure you discuss any questions you have with your health care provider.

## 2014-06-24 NOTE — MAU Note (Signed)
Thought may have kidney infection r/t back pain. Took AZO and increased fluids. Now here for pain x3-4 days in lower abdomen on right side and low back pain. Took ibuprofen for pain last pm with minimal results.

## 2014-06-24 NOTE — MAU Note (Signed)
Pt presents to MAU with complaints of pain in her lower right side and lower abdomen for about two weeks but the pain has gotten worse over the last 12 hours. Reports she has an appointment this week but husband recommended coming in today to be evaluated . History of ovarian cyst

## 2014-06-24 NOTE — MAU Provider Note (Signed)
History     CSN: [id number]  Arrival date and time: 06/24/14 0802   First Provider Initiated Contact with Patient 06/24/14 [phone]      Chief Complaint  Patient presents with  . right side pain   . Abdominal Pain   HPI Comments: Whitney Smith 33 y.o. [id number] presents to MAU with right lower quad pain that has been ongoing for 2 weeks and is worse in the last 2-3 days. Her pain is 8/10. She states she called the office and was unable to get seen until next week. She has had partial hysterectomy for endometriosis. She was diagnosed with hemorrhagic cyst last September and followed in the office by Levell July, NP with two ultrasounds. She denies any sx UTI.  Abdominal Pain      Past Medical History  Diagnosis Date  . Headache(784.0)   . Endometriosis   . PCOS (polycystic ovarian syndrome)   . Kidney infection     Past Surgical History  Procedure Laterality Date  . Tubal ligation    . Abdominal hysterectomy    . Endometrial ablation      Family History  Problem Relation Age of Onset  . Cancer Mother   . Diabetes Father   . Diabetes Maternal Grandmother   . Cancer Maternal Grandmother     History  Substance Use Topics  . Smoking status: Never Smoker   . Smokeless tobacco: Never Used  . Alcohol Use: No    Allergies:  Allergies  Allergen Reactions  . Penicillins Anaphylaxis and Shortness Of Breath  . Sulfamethoxazole Anaphylaxis and Shortness Of Breath  . Codeine Phosphate Hives    Prescriptions prior to admission  Medication Sig Dispense Refill  . ALPRAZolam (XANAX) 0.5 MG tablet Take 1 tablet (0.5 mg total) by mouth at bedtime as needed for sleep.  45 tablet  1  . ibuprofen (ADVIL,MOTRIN) 200 MG tablet Take 400 mg by mouth every 6 (six) hours as needed for headache or moderate pain.        Review of Systems  Constitutional: Negative.   HENT: Negative.   Eyes: Negative.   Respiratory: Negative.   Cardiovascular: Negative.   Gastrointestinal: Positive for  abdominal pain.  Genitourinary: Negative.   Musculoskeletal: Negative.   Skin: Negative.   Neurological: Negative.   Psychiatric/Behavioral: Negative.    Physical Exam   Blood pressure 139/58, pulse 71, temperature 98.2 F (36.8 C), resp. rate 18, height  (1.676 m), weight 238 lb (107.956 kg).  Physical Exam  Constitutional: She is oriented to person, place, and time. She appears well-developed and well-nourished. No distress.  HENT:  Head: Normocephalic and atraumatic.  Eyes: Conjunctivae are normal. Pupils are equal, round, and reactive to light.  Cardiovascular: Normal rate and regular rhythm.  Exam reveals no gallop and no friction rub.   No murmur heard. Respiratory: Effort normal and breath sounds normal. No respiratory distress. She has no wheezes. She has no rales.  GI: Soft. There is tenderness. There is no rebound and no guarding.  Genitourinary:  Genital:external/ negative/shaved Vaginal:small amount white discharged Cervix:present/ was not removed with hysterectomy/ no lesions Bimanual: no mass appreciated    Musculoskeletal: Normal range of motion.  Neurological: She is alert and oriented to person, place, and time.  Skin: Skin is warm and dry.  Psychiatric: She has a normal mood and affect. Her behavior is normal. Judgment and thought content normal.   Results for orders placed during the hospital encounter of 06/24/14 (from the past  24 hour(s))  URINALYSIS, ROUTINE W REFLEX MICROSCOPIC     Status: Abnormal   Collection Time    06/24/14  8:15 AM      Result Value Ref Range   Color, Urine YELLOW  YELLOW   APPearance CLEAR  CLEAR   Specific Gravity, Urine 1.020  1.005 - 1.030   pH 6.0  5.0 - 8.0   Glucose, UA NEGATIVE  NEGATIVE mg/dL   Hgb urine dipstick NEGATIVE  NEGATIVE   Bilirubin Urine NEGATIVE  NEGATIVE   Ketones, ur NEGATIVE  NEGATIVE mg/dL   Protein, ur NEGATIVE  NEGATIVE mg/dL   Urobilinogen, UA 0.2  0.0 - 1.0 mg/dL   Nitrite NEGATIVE  NEGATIVE    Leukocytes, UA SMALL (*) NEGATIVE  URINE MICROSCOPIC-ADD ON     Status: Abnormal   Collection Time    06/24/14  8:15 AM      Result Value Ref Range   Squamous Epithelial / LPF FEW (*) RARE   WBC, UA 3-6  <3 WBC/hpf   Urine-Other MUCOUS PRESENT    CBC     Status: None   Collection Time    06/24/14  9:55 AM      Result Value Ref Range   WBC 6.4  4.0 - 10.5 K/uL   RBC 4.23  3.87 - 5.11 MIL/uL   Hemoglobin 13.2  12.0 - 15.0 g/dL   HCT 19.1  47.8 - 29.5 %   MCV 87.7  78.0 - 100.0 fL   MCH 31.2  26.0 - 34.0 pg   MCHC 35.6  30.0 - 36.0 g/dL   RDW 62.1  30.8 - 65.7 %   Platelets 196  150 - 400 K/uL   US Transvaginal Non-ob  06/24/2014   CLINICAL DATA:  Right lower quadrant pain  EXAM: TRANSABDOMINAL AND TRANSVAGINAL ULTRASOUND OF PELVIS  TECHNIQUE: Both transabdominal and transvaginal ultrasound examinations of the pelvis were performed. Transabdominal technique was performed for global imaging of the pelvis including uterus, ovaries, adnexal regions, and pelvic cul-de-sac. It was necessary to proceed with endovaginal exam following the transabdominal exam to visualize the ovaries.  COMPARISON:  07/20/2013  FINDINGS: Uterus  Surgically absent  Right ovary  Measurements: 5.6 x 3.7 x 4.0 cm. 4.1 cm complex cystic lesion is noted within the right ovary. It is similar in size to that seen on the prior exam but shows more cystic component on the current exam consistent with a degenerating hemorrhagic cyst.  Left ovary  Measurements: 2.7 x 2.6 x 3.6 cm. Follicular changes are noted.  Other findings  No free fluid.  IMPRESSION: Complex cystic lesion within the right ovary. It is stable in size but demonstrates increased cystic component consistent with degeneration of a hemorrhagic cyst. No other focal abnormality is noted.  Status post hysterectomy.   Electronically Signed   By: Alcide Clever M.D.   On: 06/24/2014 11:55   US Pelvis Complete  06/24/2014   CLINICAL DATA:  Right lower quadrant pain  EXAM:  TRANSABDOMINAL AND TRANSVAGINAL ULTRASOUND OF PELVIS  TECHNIQUE: Both transabdominal and transvaginal ultrasound examinations of the pelvis were performed. Transabdominal technique was performed for global imaging of the pelvis including uterus, ovaries, adnexal regions, and pelvic cul-de-sac. It was necessary to proceed with endovaginal exam following the transabdominal exam to visualize the ovaries.  COMPARISON:  07/20/2013  FINDINGS: Uterus  Surgically absent  Right ovary  Measurements: 5.6 x 3.7 x 4.0 cm. 4.1 cm complex cystic lesion is noted within the right ovary. It  is similar in size to that seen on the prior exam but shows more cystic component on the current exam consistent with a degenerating hemorrhagic cyst.  Left ovary  Measurements: 2.7 x 2.6 x 3.6 cm. Follicular changes are noted.  Other findings  No free fluid.  IMPRESSION: Complex cystic lesion within the right ovary. It is stable in size but demonstrates increased cystic component consistent with degeneration of a hemorrhagic cyst. No other focal abnormality is noted.  Status post hysterectomy.   Electronically Signed   By: Alcide Clever M.D.   On: 06/24/2014 11:55    MAU Course  Procedures  MDM Wet prep/ gc/chlamydia/ urine culture Pelvic ultrasound Spoke with Dr Ellyn Hack who advised that at her office visit she have Eve consult for surgery   Assessment and Plan   A: Right Complex Cystic Lesion  P: Advised to follow in office this week coming and have surgical consult Percocet/ phenergan as needed Motrin 800 mg TID Pt declines note for work Follow up as needed  Carolynn Serve 06/24/2014, 9:49 AM

## 2014-06-25 ENCOUNTER — Telehealth: Payer: Self-pay | Admitting: Obstetrics and Gynecology

## 2014-06-25 MED ORDER — METRONIDAZOLE 500 MG PO TABS: 500.0000 mg | ORAL_TABLET | Freq: Two times a day (BID) | ORAL | Status: DC

## 2014-06-25 NOTE — Telephone Encounter (Signed)
Pt would like results from her wet prep. + many clue cells. Will treat for BV. Dr. Jackelyn Knife notied and agrees.

## 2014-06-26 LAB — URINE CULTURE
COLONY COUNT: NO GROWTH
CULTURE: NO GROWTH

## 2014-06-26 LAB — GC/CHLAMYDIA PROBE AMP
CT PROBE, AMP APTIMA: NEGATIVE
GC Probe RNA: NEGATIVE

## 2014-08-27 ENCOUNTER — Encounter (HOSPITAL_COMMUNITY): Payer: Self-pay

## 2014-09-10 ENCOUNTER — Ambulatory Visit (INDEPENDENT_AMBULATORY_CARE_PROVIDER_SITE_OTHER): Payer: Managed Care, Other (non HMO) | Admitting: Emergency Medicine

## 2014-09-10 VITALS — BP 130/70 | HR 77 | Temp 98.6°F | Resp 18 | Ht 67.0 in | Wt 246.6 lb

## 2014-09-10 DIAGNOSIS — J0100 Acute maxillary sinusitis, unspecified: Secondary | ICD-10-CM

## 2014-09-10 DIAGNOSIS — J209 Acute bronchitis, unspecified: Secondary | ICD-10-CM

## 2014-09-10 MED ORDER — PSEUDOEPHEDRINE-GUAIFENESIN ER 60-600 MG PO TB12
1.0000 | ORAL_TABLET | Freq: Two times a day (BID) | ORAL | Status: DC
Start: 2014-09-10 — End: 2015-08-23

## 2014-09-10 MED ORDER — HYDROCOD POLST-CHLORPHEN POLST 10-8 MG/5ML PO LQCR: 5.0000 mL | Freq: Two times a day (BID) | ORAL | Status: DC | PRN

## 2014-09-10 MED ORDER — LEVOFLOXACIN 500 MG PO TABS: 500.0000 mg | ORAL_TABLET | Freq: Every day | ORAL | Status: AC

## 2014-09-10 NOTE — Patient Instructions (Signed)

## 2014-09-10 NOTE — Progress Notes (Signed)
[facility] [address], [place] [address] [phone]  Date:  09/10/2014   Name:  Whitney Smith   DOB:  1981/06/09   MRN:  [id number]  PCP:  Sanjuana LettersHENSEL,WILLIAM ARTHUR, MD    Chief Complaint: Cough; Sore Throat; Nasal Congestion; Fever; and Generalized Body Aches   History of Present Illness:  Whitney Smith is a 33 y.o. very pleasant female patient who presents with the following:  Ill for a week with nasal congestion and post nasal drainage.  Purulent nasal drainage. Has a cough productive of purulent sputum.  No wheezing or shortness of breath No fever but chilled.   No nausea or vomiting.  No rash. No stool change No improvement with over the counter medications or other home remedies.  Denies other complaint or health concern today.   Patient Active Problem List   Diagnosis Date Noted  . S/P hysterectomy 11/22/2013  . Endometriosis 11/22/2013  . Onychomycosis 11/22/2013  . Insomnia 11/22/2013  . Herpes simplex 11/22/2013  . Screening cholesterol level 11/22/2013  . Routine general medical examination at a health care facility 11/22/2013  . Breast pain 11/22/2013  . OBESITY, NOS 12/23/2006  . CONSTIPATION 12/23/2006  . 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 Headache(784.0)   . Endometriosis   . PCOS (polycystic ovarian syndrome)   . Kidney infection     Past Surgical History  Procedure Laterality Date  . Tubal ligation    . Abdominal hysterectomy    . Endometrial ablation      History  Substance Use Topics  . Smoking status: Never Smoker   . Smokeless tobacco: Never Used  . Alcohol Use: No    Family History  Problem Relation Age of Onset  . Cancer Mother   . Diabetes Father   . Diabetes Maternal Grandmother   . Cancer Maternal Grandmother     Allergies  Allergen Reactions  . Penicillins Anaphylaxis and Shortness Of Breath  . Sulfamethoxazole Anaphylaxis and Shortness Of  Breath  . Codeine Phosphate Hives    Medication list has been reviewed and updated.  Current Outpatient Prescriptions on File Prior to Visit  Medication Sig Dispense Refill  . ALPRAZolam (XANAX) 0.5 MG tablet Take 1 tablet (0.5 mg total) by mouth at bedtime as needed for sleep. 45 tablet 1  . ibuprofen (ADVIL,MOTRIN) 800 MG tablet Take 1 tablet (800 mg total) by mouth 3 (three) times daily. 60 tablet 0   No current facility-administered medications on file prior to visit.    Review of Systems:  As per HPI, otherwise negative.    Physical Examination: Filed Vitals:   09/10/14 1727  BP: 130/70  Pulse: 77  Temp: 98.6 F (37 C)  Resp: 18   Filed Vitals:   09/10/14 1727  Height: 5\' 7"  (1.702 m)  Weight: 246 lb 9.6 oz (111.857 kg)   Body mass index is 38.61 kg/(m^2). Ideal Body Weight: Weight in (lb) to have BMI = 25: 159.3  GEN: obese, NAD, Non-toxic, A & O x 3 HEENT: Atraumatic, Normocephalic. Neck supple. No masses, No LAD. Ears and Nose: No external deformity. CV: RRR, No M/G/R. No JVD. No thrill. No extra heart sounds. PULM: CTA B, no wheezes, crackles, rhonchi. No retractions. No resp. distress. No accessory muscle use. ABD: S, NT, ND, +BS. No rebound. No HSM. EXTR: No c/c/e NEURO Normal gait.  PSYCH: Normally interactive. Conversant. Not depressed or anxious appearing.  Calm demeanor.    Assessment and Plan: Sinusitis Bronchitis levaquin mucinex d tussionex  Signed,  Phillips OdorJeffery Briscoe Daniello, MD

## 2015-07-10 ENCOUNTER — Encounter (HOSPITAL_COMMUNITY): Payer: Self-pay | Admitting: Neurology

## 2015-07-10 ENCOUNTER — Ambulatory Visit (INDEPENDENT_AMBULATORY_CARE_PROVIDER_SITE_OTHER): Payer: Managed Care, Other (non HMO) | Admitting: Family Medicine

## 2015-07-10 ENCOUNTER — Encounter: Payer: Self-pay | Admitting: Family Medicine

## 2015-07-10 ENCOUNTER — Emergency Department (HOSPITAL_COMMUNITY): Payer: Managed Care, Other (non HMO)

## 2015-07-10 ENCOUNTER — Emergency Department (HOSPITAL_COMMUNITY)
Admission: EM | Admit: 2015-07-10 | Discharge: 2015-07-10 | Disposition: A | Discharge status: Home / Self Care | Payer: Managed Care, Other (non HMO) | Attending: Emergency Medicine | Admitting: Emergency Medicine

## 2015-07-10 VITALS — BP 156/83 | HR 100 | Temp 98.5°F | Ht 67.0 in | Wt 272.7 lb

## 2015-07-10 DIAGNOSIS — R509 Fever, unspecified: Secondary | ICD-10-CM | POA: Insufficient documentation

## 2015-07-10 DIAGNOSIS — R1031 Right lower quadrant pain: Secondary | ICD-10-CM

## 2015-07-10 DIAGNOSIS — Z880 Allergy status to penicillin: Secondary | ICD-10-CM | POA: Diagnosis not present

## 2015-07-10 DIAGNOSIS — Z8639 Personal history of other endocrine, nutritional and metabolic disease: Secondary | ICD-10-CM | POA: Diagnosis not present

## 2015-07-10 DIAGNOSIS — Z8742 Personal history of other diseases of the female genital tract: Secondary | ICD-10-CM | POA: Diagnosis not present

## 2015-07-10 DIAGNOSIS — Z90710 Acquired absence of both cervix and uterus: Secondary | ICD-10-CM | POA: Diagnosis not present

## 2015-07-10 DIAGNOSIS — R1011 Right upper quadrant pain: Secondary | ICD-10-CM | POA: Diagnosis not present

## 2015-07-10 DIAGNOSIS — Z791 Long term (current) use of non-steroidal anti-inflammatories (NSAID): Secondary | ICD-10-CM | POA: Insufficient documentation

## 2015-07-10 DIAGNOSIS — M549 Dorsalgia, unspecified: Secondary | ICD-10-CM | POA: Insufficient documentation

## 2015-07-10 DIAGNOSIS — Z87448 Personal history of other diseases of urinary system: Secondary | ICD-10-CM | POA: Insufficient documentation

## 2015-07-10 DIAGNOSIS — R110 Nausea: Secondary | ICD-10-CM | POA: Diagnosis not present

## 2015-07-10 DIAGNOSIS — R1084 Generalized abdominal pain: Secondary | ICD-10-CM | POA: Diagnosis not present

## 2015-07-10 DIAGNOSIS — Z9049 Acquired absence of other specified parts of digestive tract: Secondary | ICD-10-CM | POA: Insufficient documentation

## 2015-07-10 LAB — POCT URINALYSIS DIPSTICK
BILIRUBIN UA: NEGATIVE
Glucose, UA: NEGATIVE
Ketones, UA: NEGATIVE
Nitrite, UA: NEGATIVE
PH UA: 6
Protein, UA: NEGATIVE
RBC UA: NEGATIVE
Spec Grav, UA: >=1.03
Urobilinogen, UA: 0.2

## 2015-07-10 LAB — URINALYSIS, ROUTINE W REFLEX MICROSCOPIC
Bilirubin Urine: NEGATIVE
Glucose, UA: NEGATIVE mg/dL
Hgb urine dipstick: NEGATIVE
Ketones, ur: NEGATIVE mg/dL
Nitrite: NEGATIVE
Protein, ur: NEGATIVE mg/dL
Specific Gravity, Urine: 1.029 (ref 1.005–1.030)
Urobilinogen, UA: 0.2 mg/dL (ref 0.0–1.0)
pH: 6 (ref 5.0–8.0)

## 2015-07-10 LAB — COMPREHENSIVE METABOLIC PANEL
ALK PHOS: 57 U/L (ref 38–126)
ALT: 32 U/L (ref 14–54)
AST: 25 U/L (ref 15–41)
Albumin: 4.3 g/dL (ref 3.5–5.0)
Anion gap: 8 (ref 5–15)
BUN: 13 mg/dL (ref 6–20)
CALCIUM: 9.5 mg/dL (ref 8.9–10.3)
CO2: 25 mmol/L (ref 22–32)
Chloride: 105 mmol/L (ref 101–111)
Creatinine, Ser: 0.79 mg/dL (ref 0.44–1.00)
GFR calc non Af Amer: >60 mL/min (ref >60)
Glucose, Bld: 97 mg/dL (ref 65–99)
Potassium: 4.1 mmol/L (ref 3.5–5.1)
Sodium: 138 mmol/L (ref 135–145)
Total Bilirubin: 0.7 mg/dL (ref 0.3–1.2)
Total Protein: 7.1 g/dL (ref 6.5–8.1)

## 2015-07-10 LAB — CBC
HCT: 40.9 % (ref 36.0–46.0)
HEMOGLOBIN: 14.4 g/dL (ref 12.0–15.0)
MCH: 30.7 pg (ref 26.0–34.0)
MCHC: 35.2 g/dL (ref 30.0–36.0)
MCV: 87.2 fL (ref 78.0–100.0)
Platelets: 222 10*3/uL (ref 150–400)
RBC: 4.69 MIL/uL (ref 3.87–5.11)
RDW: 12.9 % (ref 11.5–15.5)
WBC: 7.9 10*3/uL (ref 4.0–10.5)

## 2015-07-10 LAB — POCT UA - MICROSCOPIC ONLY

## 2015-07-10 LAB — LIPASE, BLOOD: Lipase: 19 U/L — ABNORMAL LOW (ref 22–51)

## 2015-07-10 LAB — URINE MICROSCOPIC-ADD ON

## 2015-07-10 MED ORDER — SODIUM CHLORIDE 0.9 % IV SOLN
INTRAVENOUS | Status: DC
  Administered 2015-07-10: 18:00:00 via INTRAVENOUS

## 2015-07-10 MED ORDER — HYDROMORPHONE HCL 4 MG PO TABS: 4.0000 mg | ORAL_TABLET | Freq: Four times a day (QID) | ORAL | Status: DC | PRN

## 2015-07-10 MED ORDER — ONDANSETRON HCL 4 MG/2ML IJ SOLN
4.0000 mg | Freq: Once | INTRAMUSCULAR | Status: AC
  Administered 2015-07-10: 4 mg via INTRAVENOUS
  Filled 2015-07-10: qty 2

## 2015-07-10 MED ORDER — ONDANSETRON 4 MG PO TBDP: 4.0000 mg | ORAL_TABLET | Freq: Three times a day (TID) | ORAL | Status: DC | PRN

## 2015-07-10 MED ORDER — IOHEXOL 300 MG/ML  SOLN
25.0000 mL | Freq: Once | INTRAMUSCULAR | Status: AC | PRN
  Administered 2015-07-10: 25 mL via ORAL

## 2015-07-10 MED ORDER — HYDROMORPHONE HCL 1 MG/ML IJ SOLN
1.0000 mg | Freq: Once | INTRAMUSCULAR | Status: AC
  Administered 2015-07-10: 1 mg via INTRAVENOUS
  Filled 2015-07-10: qty 1

## 2015-07-10 MED ORDER — SODIUM CHLORIDE 0.9 % IV BOLUS (SEPSIS)
1000.0000 mL | Freq: Once | INTRAVENOUS | Status: AC
  Administered 2015-07-10: 1000 mL via INTRAVENOUS

## 2015-07-10 MED ORDER — IOHEXOL 300 MG/ML  SOLN
100.0000 mL | Freq: Once | INTRAMUSCULAR | Status: AC | PRN
  Administered 2015-07-10: 100 mL via INTRAVENOUS

## 2015-07-10 NOTE — Discharge Instructions (Signed)
Workup today without any evidence of gallstones or gallbladder disease at this point. Appendix was also normal. Not unreasonable to have a hida scan ordered by your primary care doctor to evaluate the function of the gallbladder. Take medications as directed.

## 2015-07-10 NOTE — ED Notes (Signed)
CT notified pt done with her contrast

## 2015-07-10 NOTE — ED Notes (Signed)
Pt to radiology.

## 2015-07-10 NOTE — ED Provider Notes (Signed)
CSN: [id number]     Arrival date & time 07/10/15  1532 History   First MD Initiated Contact with Patient 07/10/15 1611     Chief Complaint  Patient presents with  . Abdominal Pain     (Consider location/radiation/quality/duration/timing/severity/associated sxs/prior Treatment) Patient is a 34 y.o. female presenting with abdominal pain. The history is provided by the patient.  Abdominal Pain Associated symptoms: fever and nausea   Associated symptoms: no chest pain, no diarrhea, no dysuria, no shortness of breath and no vomiting    patient referred to the emergency department for evaluation of right-sided abdominal pain. Patient followed by family practice clinic. Patient with onset of right-sided abdominal pain right lower quadrant and right upper quadrant since Monday. Associated with nausea no vomiting no diarrhea fever on Monday. The pain also radiates to the right shoulder. Patient had a pelvic ultrasound on Monday that was negative. The pain does radiate to the right back. Current pain is 10 out of 10.  Past Medical History  Diagnosis Date  . Headache(784.0)   . Endometriosis   . PCOS (polycystic ovarian syndrome)   . Kidney infection    Past Surgical History  Procedure Laterality Date  . Tubal ligation    . Abdominal hysterectomy    . Endometrial ablation     Family History  Problem Relation Age of Onset  . Cancer Mother   . Diabetes Father   . Diabetes Maternal Grandmother   . Cancer Maternal Grandmother    Social History  Substance Use Topics  . Smoking status: Never Smoker   . Smokeless tobacco: Never Used  . Alcohol Use: No   OB History    Gravida Para Term Preterm AB TAB SAB Ectopic Multiple Living   3 2 2  1  1   2      Review of Systems  Constitutional: Positive for fever.  HENT: Negative for congestion.   Eyes: Negative for visual disturbance.  Respiratory: Negative for shortness of breath.   Cardiovascular: Negative for chest pain.  Gastrointestinal:  Positive for nausea and abdominal pain. Negative for vomiting and diarrhea.  Genitourinary: Negative for dysuria.  Musculoskeletal: Positive for back pain.  Skin: Negative for rash.  Hematological: Does not bruise/bleed easily.  Psychiatric/Behavioral: Negative for confusion.      Allergies  Penicillins; Sulfamethoxazole; and Codeine phosphate  Home Medications   Prior to Admission medications   Medication Sig Start Date End Date Taking? Authorizing Provider  ALPRAZolam Prudy Feeler) 0.5 MG tablet Take 1 tablet (0.5 mg total) by mouth at bedtime as needed for sleep. 11/22/13  Yes Moses Manners, MD  ibuprofen (ADVIL,MOTRIN) 800 MG tablet Take 1 tablet (800 mg total) by mouth 3 (three) times daily. 06/24/14  Yes Delbert Phenix, NP  naproxen sodium (ANAPROX) 220 MG tablet Take 440 mg by mouth daily as needed (for pain).   Yes Historical Provider, MD  chlorpheniramine-HYDROcodone (TUSSIONEX PENNKINETIC ER) 10-8 MG/5ML LQCR Take 5 mLs by mouth every 12 (twelve) hours as needed. Patient not taking: Reported on 07/10/2015 09/10/14   Carmelina Dane, MD  HYDROmorphone (DILAUDID) 4 MG tablet Take 1 tablet (4 mg total) by mouth every 6 (six) hours as needed for severe pain. 07/10/15   Vanetta Mulders, MD  ondansetron (ZOFRAN ODT) 4 MG disintegrating tablet Take 1 tablet (4 mg total) by mouth every 8 (eight) hours as needed for nausea or vomiting. 07/10/15   Vanetta Mulders, MD  pseudoephedrine-guaifenesin (MUCINEX D) 60-600 MG per tablet Take 1 tablet  by mouth every 12 (twelve) hours. Patient not taking: Reported on 07/10/2015 09/10/14 09/10/15  Carmelina Dane, MD   BP 121/69 mmHg  Pulse 73  Temp(Src) 98.1 F (36.7 C) (Oral)  Resp 18  Ht 5\' 7"  (1.702 m)  Wt 272 lb (123.378 kg)  BMI 42.59 kg/m2  SpO2 100% Physical Exam  Constitutional: She is oriented to person, place, and time. She appears well-developed and well-nourished. No distress.  HENT:  Head: Normocephalic and atraumatic.   Mouth/Throat: Oropharynx is clear and moist.  Eyes: Conjunctivae and EOM are normal. Pupils are equal, round, and reactive to light.  Neck: Normal range of motion.  Cardiovascular: Normal rate, regular rhythm and normal heart sounds.   No murmur heard. Pulmonary/Chest: Effort normal and breath sounds normal. No respiratory distress.  Abdominal: Soft. Bowel sounds are normal. There is tenderness. There is no rebound.  Tender to palpation right side of the abdomen predominantly right upper quadrant.  Musculoskeletal: Normal range of motion.  Neurological: She is alert and oriented to person, place, and time. No cranial nerve deficit. She exhibits normal muscle tone. Coordination normal.  Skin: Skin is warm. No rash noted.  Nursing note and vitals reviewed.   ED Course  Procedures (including critical care time) Labs Review Labs Reviewed  LIPASE, BLOOD - Abnormal; Notable for the following:    Lipase 19 (*)    All other components within normal limits  URINALYSIS, ROUTINE W REFLEX MICROSCOPIC (NOT AT [facility]) - Abnormal; Notable for the following:    APPearance TURBID (*)    Leukocytes, UA MODERATE (*)    All other components within normal limits  URINE MICROSCOPIC-ADD ON - Abnormal; Notable for the following:    Squamous Epithelial / LPF FEW (*)    All other components within normal limits  COMPREHENSIVE METABOLIC PANEL  CBC   Results for orders placed or performed during the hospital encounter of 07/10/15  Lipase, blood  Result Value Ref Range   Lipase 19 (L) 22 - 51 U/L  Comprehensive metabolic panel  Result Value Ref Range   Sodium 138 135 - 145 mmol/L   Potassium 4.1 3.5 - 5.1 mmol/L   Chloride 105 101 - 111 mmol/L   CO2 25 22 - 32 mmol/L   Glucose, Bld 97 65 - 99 mg/dL   BUN 13 6 - 20 mg/dL   Creatinine, Ser 9.56 0.44 - 1.00 mg/dL   Calcium 9.5 8.9 - 21.3 mg/dL   Total Protein 7.1 6.5 - 8.1 g/dL   Albumin 4.3 3.5 - 5.0 g/dL   AST 25 15 - 41 U/L   ALT 32 14 - 54 U/L    Alkaline Phosphatase 57 38 - 126 U/L   Total Bilirubin 0.7 0.3 - 1.2 mg/dL   GFR calc non Af Amer >60 >60 mL/min   GFR calc Af Amer >60 >60 mL/min   Anion gap 8 5 - 15  CBC  Result Value Ref Range   WBC 7.9 4.0 - 10.5 K/uL   RBC 4.69 3.87 - 5.11 MIL/uL   Hemoglobin 14.4 12.0 - 15.0 g/dL   HCT 08.6 57.8 - 46.9 %   MCV 87.2 78.0 - 100.0 fL   MCH 30.7 26.0 - 34.0 pg   MCHC 35.2 30.0 - 36.0 g/dL   RDW 62.9 52.8 - 41.3 %   Platelets 222 150 - 400 K/uL  Urinalysis, Routine w reflex microscopic (not at [facility])  Result Value Ref Range   Color, Urine YELLOW YELLOW  APPearance TURBID (A) CLEAR   Specific Gravity, Urine 1.029 1.005 - 1.030   pH 6.0 5.0 - 8.0   Glucose, UA NEGATIVE NEGATIVE mg/dL   Hgb urine dipstick NEGATIVE NEGATIVE   Bilirubin Urine NEGATIVE NEGATIVE   Ketones, ur NEGATIVE NEGATIVE mg/dL   Protein, ur NEGATIVE NEGATIVE mg/dL   Urobilinogen, UA 0.2 0.0 - 1.0 mg/dL   Nitrite NEGATIVE NEGATIVE   Leukocytes, UA MODERATE (A) NEGATIVE  Urine microscopic-add on  Result Value Ref Range   Squamous Epithelial / LPF FEW (A) RARE   WBC, UA 0-2 <3 WBC/hpf   RBC / HPF 0-2 <3 RBC/hpf   Bacteria, UA RARE RARE     Imaging Review Dg Chest 2 View  07/10/2015   CLINICAL DATA:  Abdominal pain.  EXAM: CHEST  2 VIEW  COMPARISON:  07/27/2011  FINDINGS: The heart size and mediastinal contours are within normal limits. Both lungs are clear. The visualized skeletal structures are unremarkable.  IMPRESSION: No active cardiopulmonary disease.   Electronically Signed   By: Myles Rosenthal M.D.   On: 07/10/2015 18:18   Ct Abdomen Pelvis W Contrast  07/10/2015   CLINICAL DATA:  Right lower quadrant abdomen pain since Monday.  EXAM: CT ABDOMEN AND PELVIS WITH CONTRAST  TECHNIQUE: Multidetector CT imaging of the abdomen and pelvis was performed using the standard protocol following bolus administration of intravenous contrast.  CONTRAST:  OMNIPAQUE IOHEXOL 300 MG/ML  SOLN  COMPARISON:  None.   FINDINGS: The liver, spleen, pancreas, gallbladder, adrenal glands and kidneys are normal. There is no hydronephrosis bilaterally. The aorta is normal. There is no abdominal lymphadenopathy.  There is no small bowel obstruction or diverticulitis. The appendix is normal. Moderate bowel content is identified in the colon.  Fluid-filled bladder is normal. The patient is status post prior hysterectomy. Small cysts in normal size bilateral ovaries are noted. There is a 1.9 cm cyst in the cervix, nabothian cyst. Visualized lung bases are clear. No acute abnormalities identified within the visualized bones.  IMPRESSION: No acute abnormality identified in the abdomen and pelvis. The appendix is normal. There is no small bowel obstruction.   Electronically Signed   By: Sherian Rein M.D.   On: 07/10/2015 19:21   US Abdomen Limited  07/10/2015   CLINICAL DATA:  34 year old female with right upper quadrant abdominal pain.  EXAM: US ABDOMEN LIMITED - RIGHT UPPER QUADRANT  COMPARISON:  CT dated 07/10/2015  FINDINGS: Gallbladder:  No gallstones or wall thickening visualized. No sonographic Murphy sign noted.  Common bile duct:  Diameter: 3 mm  Liver:  No focal lesion identified. Within normal limits in parenchymal echogenicity.  IMPRESSION: Unremarkable right upper quadrant ultrasound.   Electronically Signed   By: Elgie Collard M.D.   On: 07/10/2015 20:58   I have personally reviewed and evaluated these images and lab results as part of my medical decision-making.   EKG Interpretation None      MDM   Final diagnoses:  Right upper quadrant pain    Name workup for the right-sided abdominal pain without any acute findings. No evidence of appendicitis. Extensive workup on the gallbladder without any evidence of gallstones or sludge or any gallbladder abnormalities. Not unreasonable for primary care doctor to schedule HIDA  scan to evaluate the function of the gallbladder. Will discharge home with some pain  medicine and antinausea medicine.  Vanetta Mulders, MD 07/10/15 2131

## 2015-07-10 NOTE — Patient Instructions (Signed)

## 2015-07-10 NOTE — Progress Notes (Signed)
Subjective:     Patient ID: Whitney Smith, female   DOB: 06-28-1981, 34 y.o.   MRN: [id number]  HPI Whitney Smith is a 34yo female presenting today for abdominal pain. - First noted back pain on Thursday (9/8). Abdominal pain since 9/10 - Notes history of complex cyst on ovary. Went to Ob/Gyn on 9/5 and states they ruled out gynecological etiology of pain. States urinalysis at that time showed something elevated, however she does not remember what. States they told her she did not have a UTI. History of hysterectomy. - Notes lower abdominal pain, worse on right side and in lower back. Also notes pain up to left shoulder blade - Notes nausea, but denies vomiting. Also notes decreased appetite and constipation. - Had history of low grade fever on Sunday and Monday (9/11 and 9/12) - Notes she feels bloated, has bad taste, has been having trouble sleeping - Denies dysuria or increased frequency. States abdominal pain worsens when urinating. - Has been using Ibuprofen, however this is not touching the pain.  Review of Systems  Constitutional: Positive for fever and appetite change.  Gastrointestinal: Positive for nausea, abdominal pain and constipation. Negative for vomiting and diarrhea.  Genitourinary: Negative for dysuria, urgency and frequency.  Musculoskeletal: Positive for back pain.       Objective:   Physical Exam  Constitutional: She appears well-developed and well-nourished. No distress.  HENT:  Head: Normocephalic and atraumatic.  Cardiovascular: Normal rate and regular rhythm.  Exam reveals no gallop and no friction rub.   No murmur heard. Pulmonary/Chest: Effort normal and breath sounds normal. No respiratory distress. She has no wheezes.  Abdominal: Soft. Bowel sounds are normal.  Significant abdominal tenderness in lower quadrants. + McBurney's. Tenderness over right flank. + Rebound. + Rovsing's.   Musculoskeletal: She exhibits no edema.  Neurological: She is alert.  Skin:  Skin is warm. No rash noted.  Psychiatric: She has a normal mood and affect. Her behavior is normal.       Assessment and Plan:     Abdominal pain - Concern for acute abdomen given + McBurney's, + Rovsing's, + Rebound - Agreeable to going to ED for further evaluation and imaging of pain. Nursing to wheel to ED. Stable for transfer and does not need transfer via ambulance.

## 2015-07-10 NOTE — ED Notes (Signed)
Saturday night developed RLQ abd pain, since Monday has been having shooting pain up to left shoulder. Sent here from family medicine for r/o appendicitis. Pt is a x 4. Has been nauseated, low grade fever on Sunday.

## 2015-07-10 NOTE — ED Notes (Signed)
Patient transported to Ultrasound 

## 2015-07-13 DIAGNOSIS — R109 Unspecified abdominal pain: Secondary | ICD-10-CM | POA: Insufficient documentation

## 2015-07-13 NOTE — Assessment & Plan Note (Signed)
-   Concern for acute abdomen given + McBurney's, + Rovsing's, + Rebound - Agreeable to going to ED for further evaluation and imaging of pain. Nursing to wheel to ED. Stable for transfer and does not need transfer via ambulance.

## 2015-08-23 ENCOUNTER — Encounter (HOSPITAL_COMMUNITY): Payer: Self-pay | Admitting: *Deleted

## 2015-08-23 ENCOUNTER — Emergency Department (HOSPITAL_COMMUNITY): Payer: Managed Care, Other (non HMO)

## 2015-08-23 ENCOUNTER — Emergency Department (HOSPITAL_COMMUNITY)
Admission: EM | Admit: 2015-08-23 | Discharge: 2015-08-24 | Disposition: A | Discharge status: Home / Self Care | Payer: Managed Care, Other (non HMO) | Attending: Emergency Medicine | Admitting: Emergency Medicine

## 2015-08-23 DIAGNOSIS — S8012XA Contusion of left lower leg, initial encounter: Secondary | ICD-10-CM | POA: Diagnosis not present

## 2015-08-23 DIAGNOSIS — S299XXA Unspecified injury of thorax, initial encounter: Secondary | ICD-10-CM | POA: Insufficient documentation

## 2015-08-23 DIAGNOSIS — S6992XA Unspecified injury of left wrist, hand and finger(s), initial encounter: Secondary | ICD-10-CM | POA: Diagnosis present

## 2015-08-23 DIAGNOSIS — Z880 Allergy status to penicillin: Secondary | ICD-10-CM | POA: Insufficient documentation

## 2015-08-23 DIAGNOSIS — Y92410 Unspecified street and highway as the place of occurrence of the external cause: Secondary | ICD-10-CM | POA: Diagnosis not present

## 2015-08-23 DIAGNOSIS — S62398A Other fracture of other metacarpal bone, initial encounter for closed fracture: Secondary | ICD-10-CM | POA: Diagnosis not present

## 2015-08-23 DIAGNOSIS — Z791 Long term (current) use of non-steroidal anti-inflammatories (NSAID): Secondary | ICD-10-CM | POA: Diagnosis not present

## 2015-08-23 DIAGNOSIS — Z8639 Personal history of other endocrine, nutritional and metabolic disease: Secondary | ICD-10-CM | POA: Insufficient documentation

## 2015-08-23 DIAGNOSIS — Z87448 Personal history of other diseases of urinary system: Secondary | ICD-10-CM | POA: Insufficient documentation

## 2015-08-23 DIAGNOSIS — Y998 Other external cause status: Secondary | ICD-10-CM | POA: Insufficient documentation

## 2015-08-23 DIAGNOSIS — S99921A Unspecified injury of right foot, initial encounter: Secondary | ICD-10-CM | POA: Diagnosis not present

## 2015-08-23 DIAGNOSIS — Z8742 Personal history of other diseases of the female genital tract: Secondary | ICD-10-CM | POA: Insufficient documentation

## 2015-08-23 DIAGNOSIS — Y9389 Activity, other specified: Secondary | ICD-10-CM | POA: Diagnosis not present

## 2015-08-23 DIAGNOSIS — S8011XA Contusion of right lower leg, initial encounter: Secondary | ICD-10-CM | POA: Insufficient documentation

## 2015-08-23 DIAGNOSIS — S62309A Unspecified fracture of unspecified metacarpal bone, initial encounter for closed fracture: Secondary | ICD-10-CM

## 2015-08-23 DIAGNOSIS — S0081XA Abrasion of other part of head, initial encounter: Secondary | ICD-10-CM | POA: Insufficient documentation
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
## 2015-08-24 MED ORDER — OXYCODONE-ACETAMINOPHEN 5-325 MG PO TABS
2.0000 | ORAL_TABLET | Freq: Once | ORAL | Status: AC
  Administered 2015-08-24: 2 via ORAL
  Filled 2015-08-24: qty 2

## 2015-08-24 MED ORDER — OXYCODONE-ACETAMINOPHEN 5-325 MG PO TABS: 1.0000 | ORAL_TABLET | ORAL | Status: DC | PRN

## 2015-08-24 NOTE — Discharge Instructions (Signed)
Take the prescribed medication as directed for pain-- use caution, can make you drowsy. Follow-up with hand surgery, Dr. Merlyn LotKuzma-- call to make appt. Return to the ED for new or worsening symptoms.

## 2015-08-24 NOTE — Progress Notes (Signed)
Orthopedic Tech Progress Note Patient Details:  Whitney Smith April 27, 1981 [id number] Applied fiberglass ulnar gutter splint to LUE.  Pulses, sensation, motion intact before and after splinting.  Capillary refill less than 2 seconds before and after splinting.   Ortho Devices Type of Ortho Device: Ulna gutter splint Ortho Device/Splint Location: LUE Ortho Device/Splint Interventions: Application   Lesle ChrisGilliland, Keyshaun Exley L 08/24/2015, 2:58 AM

## 2016-07-21 ENCOUNTER — Other Ambulatory Visit: Payer: Self-pay | Admitting: Family Medicine

## 2016-07-21 DIAGNOSIS — Z1231 Encounter for screening mammogram for malignant neoplasm of breast: Secondary | ICD-10-CM

## 2016-08-03 ENCOUNTER — Ambulatory Visit
Admission: RE | Admit: 2016-08-03 | Discharge: 2016-08-03 | Disposition: A | Discharge status: Home / Self Care | Payer: Managed Care, Other (non HMO) | Source: Ambulatory Visit | Attending: Family Medicine | Admitting: Family Medicine

## 2016-08-03 DIAGNOSIS — Z1231 Encounter for screening mammogram for malignant neoplasm of breast: Secondary | ICD-10-CM

## 2016-08-06 ENCOUNTER — Other Ambulatory Visit: Payer: Self-pay | Admitting: Family Medicine

## 2016-08-06 DIAGNOSIS — R928 Other abnormal and inconclusive findings on diagnostic imaging of breast: Secondary | ICD-10-CM

## 2016-08-07 ENCOUNTER — Other Ambulatory Visit: Payer: Self-pay | Admitting: Family Medicine

## 2016-08-07 ENCOUNTER — Telehealth: Payer: Self-pay | Admitting: Family Medicine

## 2016-08-07 ENCOUNTER — Other Ambulatory Visit: Payer: Self-pay

## 2016-08-07 DIAGNOSIS — R928 Other abnormal and inconclusive findings on diagnostic imaging of breast: Secondary | ICD-10-CM

## 2016-08-07 NOTE — Telephone Encounter (Signed)
[facility] faxed over a form earlier in the week for Dr. Leveda AnnaHensel to sign for a diagnostic testing. They had to have the form signed by today because pt's appointment is on Monday. Since Dr. Leveda AnnaHensel is out of the office, Dr. Lum BabeEniola signed the form. Form was faxed to [facility]. Thanks! ep

## 2016-08-10 ENCOUNTER — Ambulatory Visit
Admission: RE | Admit: 2016-08-10 | Discharge: 2016-08-10 | Disposition: A | Discharge status: Home / Self Care | Payer: Managed Care, Other (non HMO) | Source: Ambulatory Visit | Attending: Family Medicine | Admitting: Family Medicine

## 2016-08-10 DIAGNOSIS — R928 Other abnormal and inconclusive findings on diagnostic imaging of breast: Secondary | ICD-10-CM

## 2016-11-23 ENCOUNTER — Emergency Department (HOSPITAL_COMMUNITY)
Admission: EM | Admit: 2016-11-23 | Discharge: 2016-11-23 | Disposition: A | Discharge status: Home / Self Care | Payer: Managed Care, Other (non HMO) | Attending: Emergency Medicine | Admitting: Emergency Medicine

## 2016-11-23 ENCOUNTER — Encounter (HOSPITAL_COMMUNITY): Payer: Self-pay | Admitting: Emergency Medicine

## 2016-11-23 DIAGNOSIS — G43909 Migraine, unspecified, not intractable, without status migrainosus: Secondary | ICD-10-CM | POA: Diagnosis present

## 2016-11-23 DIAGNOSIS — R519 Headache, unspecified: Secondary | ICD-10-CM

## 2016-11-23 DIAGNOSIS — R51 Headache: Secondary | ICD-10-CM | POA: Diagnosis not present

## 2016-11-23 MED ORDER — DIPHENHYDRAMINE HCL 50 MG/ML IJ SOLN
25.0000 mg | Freq: Once | INTRAMUSCULAR | Status: AC
  Administered 2016-11-23: 25 mg via INTRAVENOUS
  Filled 2016-11-23: qty 1

## 2016-11-23 MED ORDER — METOCLOPRAMIDE HCL 5 MG/ML IJ SOLN
10.0000 mg | Freq: Once | INTRAMUSCULAR | Status: AC
  Administered 2016-11-23: 10 mg via INTRAVENOUS
  Filled 2016-11-23: qty 2

## 2016-11-23 MED ORDER — SODIUM CHLORIDE 0.9 % IV BOLUS (SEPSIS)
1000.0000 mL | Freq: Once | INTRAVENOUS | Status: AC
  Administered 2016-11-23: 1000 mL via INTRAVENOUS

## 2016-11-23 MED ORDER — MAGNESIUM SULFATE 2 GM/50ML IV SOLN
2.0000 g | Freq: Once | INTRAVENOUS | Status: AC
  Administered 2016-11-23: 2 g via INTRAVENOUS
  Filled 2016-11-23: qty 50

## 2016-11-23 MED ORDER — DEXAMETHASONE SODIUM PHOSPHATE 10 MG/ML IJ SOLN
10.0000 mg | Freq: Once | INTRAMUSCULAR | Status: AC
  Administered 2016-11-23: 10 mg via INTRAVENOUS
  Filled 2016-11-23: qty 1

## 2016-11-23 MED ORDER — KETOROLAC TROMETHAMINE 30 MG/ML IJ SOLN
30.0000 mg | Freq: Once | INTRAMUSCULAR | Status: AC
  Administered 2016-11-23: 30 mg via INTRAVENOUS
  Filled 2016-11-23: qty 1

## 2016-11-23 NOTE — ED Notes (Signed)
ED Provider at bedside. 

## 2016-11-23 NOTE — Discharge Instructions (Signed)
Please read and follow all provided instructions.  Your diagnoses today include:  1. Nonintractable headache, unspecified chronicity pattern, unspecified headache type     Tests performed today include: CT of your head which was normal and did not show any serious cause of your headache Vital signs. See below for your results today.   Medications:  In the Emergency Department you received: Reglan - antinausea/headache medication Benadryl - antihistamine to counteract potential side effects of reglan Toradol - NSAID medication similar to ibuprofen  Take any prescribed medications only as directed.  Additional information:  Follow any educational materials contained in this packet.  You are having a headache. No specific cause was found today for your headache. It may have been a migraine or other cause of headache. Stress, anxiety, fatigue, and depression are common triggers for headaches.   Your headache today does not appear to be life-threatening or require hospitalization, but often the exact cause of headaches is not determined in the emergency department. Therefore, follow-up with your doctor is very important to find out what may have caused your headache and whether or not you need any further diagnostic testing or treatment.   Sometimes headaches can appear benign (not harmful), but then more serious symptoms can develop which should prompt an immediate re-evaluation by your doctor or the emergency department.  BE VERY CAREFUL not to take multiple medicines containing Tylenol (also called acetaminophen). Doing so can lead to an overdose which can damage your liver and cause liver failure and possibly death.   Follow-up instructions: Please follow-up with your primary care provider in the next 3 days for further evaluation of your symptoms.   Return instructions:  Please return to the Emergency Department if you experience worsening symptoms. Return if the medications do not  resolve your headache, if it recurs, or if you have multiple episodes of vomiting or cannot keep down fluids. Return if you have a change from the usual headache. RETURN IMMEDIATELY IF you: Develop a sudden, severe headache Develop confusion or become poorly responsive or faint Develop a fever above 100.19F or problem breathing Have a change in speech, vision, swallowing, or understanding Develop new weakness, numbness, tingling, incoordination in your arms or legs Have a seizure Please return if you have any other emergent concerns.  Additional Information:  Your vital signs today were: BP (!) 107/53    Pulse (!) 54    Temp 98.2 F (36.8 C) (Oral)    Resp 16    Ht 5\' 7"  (1.702 m)    Wt 108.9 kg    SpO2 100%    BMI 37.59 kg/m  If your blood pressure (BP) was elevated above 135/85 this visit, please have this repeated by your doctor within one month. --------------

## 2016-11-23 NOTE — ED Provider Notes (Signed)
[facility]-EMERGENCY DEPT Provider Note   CSN: [id number] Arrival date & time: 11/23/16  4098     History   Chief Complaint Chief Complaint  Patient presents with  . Migraine    HPI Whitney Smith is a 36 y.o. female.  HPI  36 y.o. female, presents to the Emergency Department today complaining of migraine x 2 days. Hx similar as teenager, but states this feels worse. Notes left sided "tingling" and states that her ear feels blocked. MIld nausea without emesis. Rates pain 10/10. Attempted OTC remedies with minimal relief. No decrease in grip strength. Does endorse photophobia withotu blurriness. No other symptoms noted.   Past Medical History:  Diagnosis Date  . Endometriosis   . Headache(784.0)   . Kidney infection   . PCOS (polycystic ovarian syndrome)     Patient Active Problem List   Diagnosis Date Noted  . Abdominal pain 07/13/2015  . S/P hysterectomy 11/22/2013  . Endometriosis 11/22/2013  . Onychomycosis 11/22/2013  . Insomnia 11/22/2013  . Herpes simplex 11/22/2013  . Screening cholesterol level 11/22/2013  . Routine general medical examination at a health care facility 11/22/2013  . Breast pain 11/22/2013  . OBESITY, NOS 12/23/2006  . CONSTIPATION 12/23/2006  . 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 ABDOMINAL HYSTERECTOMY    . ENDOMETRIAL ABLATION    . TUBAL LIGATION      OB History    Gravida Para Term Preterm AB Living   3 2 2   1 2    SAB TAB Ectopic Multiple Live Births   1               Home Medications    Prior to Admission medications   Medication Sig Start Date End Date Taking? Authorizing Provider  HYDROmorphone (DILAUDID) 4 MG tablet Take 1 tablet (4 mg total) by mouth every 6 (six) hours as needed for severe pain. Patient not taking: Reported on 08/23/2015 07/10/15   Vanetta Mulders, MD  ibuprofen (ADVIL,MOTRIN) 800 MG tablet Take 800 mg by mouth every 8 (eight) hours as needed  (for pain.).    Historical Provider, MD  ondansetron (ZOFRAN ODT) 4 MG disintegrating tablet Take 1 tablet (4 mg total) by mouth every 8 (eight) hours as needed for nausea or vomiting. Patient not taking: Reported on 08/23/2015 07/10/15   Vanetta Mulders, MD  oxyCODONE-acetaminophen (PERCOCET/ROXICET) 5-325 MG tablet Take 1 tablet by mouth every 4 (four) hours as needed. 08/24/15   Garlon Hatchet, PA-C    Family History Family History  Problem Relation Age of Onset  . Cancer Mother   . Diabetes Father   . Diabetes Maternal Grandmother   . Cancer Maternal Grandmother     Social History Social History  Substance Use Topics  . Smoking status: Never Smoker  . Smokeless tobacco: Never Used  . Alcohol use No     Allergies   Penicillins; Sulfamethoxazole; and Codeine phosphate   Review of Systems Review of Systems ROS reviewed and all are negative for acute change except as noted in the HPI.  Physical Exam Updated Vital Signs BP 130/71   Pulse 66   Temp 98.2 F (36.8 C) (Oral)   Resp 16   Ht 5\' 7"  (1.702 m)   Wt 108.9 kg   SpO2 97%   BMI 37.59 kg/m   Physical Exam  Constitutional: She is oriented to person, place, and time. Vital signs are normal. She appears well-developed and  well-nourished.  HENT:  Head: Normocephalic and atraumatic.  Right Ear: Hearing normal.  Left Ear: Hearing normal.  Eyes: Conjunctivae and EOM are normal. Pupils are equal, round, and reactive to light.  Neck: Normal range of motion. Neck supple.  Cardiovascular: Normal rate, regular rhythm, normal heart sounds and intact distal pulses.   Pulmonary/Chest: Effort normal and breath sounds normal. No respiratory distress.  Abdominal: Soft.  Musculoskeletal: Normal range of motion.  Neurological: She is alert and oriented to person, place, and time. She has normal strength. No cranial nerve deficit or sensory deficit.  Cranial Nerves:  II: Pupils equal, round, reactive to light III,IV, VI:  ptosis not present, extra-ocular motions intact bilaterally  V,VII: smile symmetric, facial light touch sensation equal VIII: hearing grossly normal bilaterally  IX,X: midline uvula rise  XI: bilateral shoulder shrug equal and strong XII: midline tongue extension Negative pronator drift. Normal finger to nose exam. Sensation intact. Grip strengths equal  Skin: Skin is warm and dry.  Psychiatric: She has a normal mood and affect. Her speech is normal and behavior is normal. Thought content normal.  Nursing note and vitals reviewed.  ED Treatments / Results  Labs (all labs ordered are listed, but only abnormal results are displayed) Labs Reviewed - No data to display  EKG  EKG Interpretation None       Radiology No results found.  Procedures Procedures (including critical care time)  Medications Ordered in ED Medications - No data to display   Initial Impression / Assessment and Plan / ED Course  I have reviewed the triage vital signs and the nursing notes.  Pertinent labs & imaging results that were available during my care of the patient were reviewed by me and considered in my medical decision making (see chart for details).  Final Clinical Impressions(s) / ED Diagnoses     {I have reviewed the relevant previous healthcare records.  {I obtained HPI from historian.   ED Course:  Assessment: Patient is a 35yF that presents with headache x 1-2 days. Patient is without high-risk features of headache including: Sudden onset/thunderclap HA, No similar headache in past, Altered mental status, Accompanying seizure, Headache with exertion, Age > 50, History of immunocompromise, Neck or shoulder pain, Fever, Use of anticoagulation, Family history of spontaneous SAH, Concomitant drug use, Toxic exposure.  Patient has a normal complete neurological exam, normal vital signs, normal level of consciousness, no signs of meningismus, is well-appearing/non-toxic appearing, no signs of  trauma. No papilledema, no pain over the temporal arteries. Imaging with CT/MRI not indicated given history and physical exam findings. No dangerous or life-threatening conditions suspected or identified by history, physical exam, and by work-up. No indications for hospitalization identified. Given Toradol, Reglan, Benadryl, Decadron with IV fluids with minimal relief. Given 2g Mg Push over 15 min with improvement. At time of discharge, Patient is in no acute distress. Vital Signs are stable. Patient is able to ambulate. Patient able to tolerate PO.    Disposition/Plan:  DC Home Additional Verbal discharge instructions given and discussed with patient.  Pt Instructed to f/u with PCP in the next week for evaluation and treatment of symptoms. Return precautions given Pt acknowledges and agrees with plan  Supervising Physician Maia PlanJoshua G Long, MD  Final diagnoses:  Nonintractable headache, unspecified chronicity pattern, unspecified headache type    New Prescriptions New Prescriptions   No medications on file     Audry Piliyler Khairi Garman, PA-C 11/23/16 1008    Maia PlanJoshua G Long, MD 11/24/16 1038

## 2016-11-23 NOTE — ED Triage Notes (Signed)
Patient reports migraine x2 days. States the left side of her face is "tingling" and states her left ear feels "blocked." Reports nausea; denies vomiting. Patient is conscious, alert, oriented, ambulatory.

## 2016-11-25 ENCOUNTER — Telehealth: Payer: Self-pay | Admitting: Family Medicine

## 2016-11-25 ENCOUNTER — Ambulatory Visit (INDEPENDENT_AMBULATORY_CARE_PROVIDER_SITE_OTHER): Payer: Managed Care, Other (non HMO) | Admitting: Family Medicine

## 2016-11-25 ENCOUNTER — Encounter: Payer: Self-pay | Admitting: Family Medicine

## 2016-11-25 DIAGNOSIS — R200 Anesthesia of skin: Secondary | ICD-10-CM | POA: Insufficient documentation

## 2016-11-25 DIAGNOSIS — Z23 Encounter for immunization: Secondary | ICD-10-CM | POA: Diagnosis not present

## 2016-11-25 DIAGNOSIS — G43009 Migraine without aura, not intractable, without status migrainosus: Secondary | ICD-10-CM | POA: Diagnosis not present

## 2016-11-25 DIAGNOSIS — G44209 Tension-type headache, unspecified, not intractable: Secondary | ICD-10-CM

## 2016-11-25 DIAGNOSIS — H53482 Generalized contraction of visual field, left eye: Secondary | ICD-10-CM | POA: Diagnosis not present

## 2016-11-25 LAB — COMPLETE METABOLIC PANEL WITH GFR
ALBUMIN: 4.1 g/dL (ref 3.6–5.1)
ALT: 17 U/L (ref 6–29)
AST: 12 U/L (ref 10–30)
Alkaline Phosphatase: 50 U/L (ref 33–115)
BUN: 14 mg/dL (ref 7–25)
CO2: 25 mmol/L (ref 20–31)
Calcium: 9.2 mg/dL (ref 8.6–10.2)
Chloride: 109 mmol/L (ref 98–110)
Creat: 0.75 mg/dL (ref 0.50–1.10)
GFR, Est African American: >89 mL/min (ref >=60)
GLUCOSE: 85 mg/dL (ref 65–99)
POTASSIUM: 4.5 mmol/L (ref 3.5–5.3)
SODIUM: 143 mmol/L (ref 135–146)
Total Bilirubin: 0.7 mg/dL (ref 0.2–1.2)
Total Protein: 7.1 g/dL (ref 6.1–8.1)

## 2016-11-25 MED ORDER — CYCLOBENZAPRINE HCL 10 MG PO TABS: 10.0000 mg | ORAL_TABLET | Freq: Three times a day (TID) | ORAL | 0 refills | Status: DC | PRN

## 2016-11-25 MED ORDER — TRAMADOL HCL 50 MG PO TABS: 50.0000 mg | ORAL_TABLET | Freq: Three times a day (TID) | ORAL | 0 refills | Status: DC | PRN

## 2016-11-25 MED ORDER — KETOROLAC TROMETHAMINE 10 MG PO TABS: 10.0000 mg | ORAL_TABLET | Freq: Four times a day (QID) | ORAL | 0 refills | Status: DC | PRN

## 2016-11-25 NOTE — Assessment & Plan Note (Signed)
Exam and history are most consistent with mixed headache.  Too late in episode for me to use tryptans.  Will Rx with analgesics and muscle relaxers and bed rest to break the cycle. Head CT because of focal neuro sx.

## 2016-11-25 NOTE — Patient Instructions (Signed)
The nurse will schedule the CT scan. I will call with blood and CT results. Take your medication regularly to break the cycle.

## 2016-11-25 NOTE — Progress Notes (Signed)
   Subjective:    Patient ID: Whitney Smith, female    DOB: Jul 18, 1981, 36 y.o.   MRN: [id number]  HPI  Severe HA for two weeks.   Whitney Smith is previously healthy 36 yo female with infrequent headaches until 2 weeks ago when she began having daily, severe headaches.  She works for [employer] and had her eyes check - did not help. HAs are accompanied by photophobia, phonophobia and nausea.  More recently, she has tightness in the back of neck.  No fever, chills, cough or sore throat, I.e. Nothing to suggest infection.  Over last 4 days has the sensation of left facial numbness.  Left arm and leg are fine.  No trouble walking.  Also feels as if she is drugged.  Seen in ER.  No testing done.  Still in pain. Had similar spell of headaches as a teen.  No sustained headaches since.       Review of Systems     Objective:   Physical Exam  Fundoscopic, normal sharp disc margins. EOMI, PERRLA.  Subjective numbness to light touch left face.   Marked tenderness over temporal muscles bilaterally.  Mild tenderness over occipital rigid and neck extensors.  No nuchal rigidity. Neuro.  Except for facial numbness normal.        Assessment & Plan:

## 2016-11-25 NOTE — Telephone Encounter (Signed)
[facility] called. She has lost vision in the right quadrannt of the visual field in the lefts. Called to see if labs were back.  [facility]  [phone]

## 2016-11-25 NOTE — Assessment & Plan Note (Addendum)
Focal neuro symptom in the setting of progressive headache triggers me to order head CT scan.  Given numbness duration of several days, sx is atypical for migraine aura.

## 2016-11-26 DIAGNOSIS — H53482 Generalized contraction of visual field, left eye: Secondary | ICD-10-CM | POA: Insufficient documentation

## 2016-11-26 NOTE — Telephone Encounter (Signed)
Cancelled CT and [facility]02/16 @ 9:00 am arriving at 8:45 am. Sunday SpillersSharon T Saunders, CMA

## 2016-11-26 NOTE — Addendum Note (Signed)
Addended by: Moses MannersHENSEL, Kenna Kirn A on: 11/26/2016 10:11 AM   Modules accepted: Orders

## 2016-11-26 NOTE — Telephone Encounter (Signed)
LVM for pt to call. If pt calls, please give appointment information below. Sunday SpillersSharon T Saunders, CMA

## 2016-11-26 NOTE — Telephone Encounter (Signed)
Spoke with Dr. Jimmye NormanSteven Miller and gave results requested, and faxed them over per his request. Maryjean Mornempestt S Nycere Presley, CMA

## 2016-11-27 ENCOUNTER — Other Ambulatory Visit: Payer: Managed Care, Other (non HMO)

## 2016-11-27 NOTE — Telephone Encounter (Signed)
Called, went to voice mail.  LM about MRI again.  Asked her to call our office to confirm that she received this message.

## 2016-11-28 ENCOUNTER — Encounter (HOSPITAL_COMMUNITY): Payer: Self-pay | Admitting: Emergency Medicine

## 2016-11-28 ENCOUNTER — Emergency Department (HOSPITAL_COMMUNITY)
Admission: EM | Admit: 2016-11-28 | Discharge: 2016-11-28 | Disposition: A | Discharge status: Home / Self Care | Payer: Managed Care, Other (non HMO) | Attending: Emergency Medicine | Admitting: Emergency Medicine

## 2016-11-28 ENCOUNTER — Emergency Department (HOSPITAL_COMMUNITY): Payer: Managed Care, Other (non HMO)

## 2016-11-28 DIAGNOSIS — Z79899 Other long term (current) drug therapy: Secondary | ICD-10-CM | POA: Diagnosis not present

## 2016-11-28 DIAGNOSIS — R519 Headache, unspecified: Secondary | ICD-10-CM

## 2016-11-28 DIAGNOSIS — R51 Headache: Secondary | ICD-10-CM | POA: Diagnosis not present

## 2016-11-28 MED ORDER — SODIUM CHLORIDE 0.9 % IV BOLUS (SEPSIS)
1000.0000 mL | Freq: Once | INTRAVENOUS | Status: AC
  Administered 2016-11-28: 1000 mL via INTRAVENOUS

## 2016-11-28 MED ORDER — DEXAMETHASONE SODIUM PHOSPHATE 10 MG/ML IJ SOLN
10.0000 mg | Freq: Once | INTRAMUSCULAR | Status: AC
  Administered 2016-11-28: 10 mg via INTRAVENOUS
  Filled 2016-11-28: qty 1

## 2016-11-28 MED ORDER — DIPHENHYDRAMINE HCL 50 MG/ML IJ SOLN
25.0000 mg | Freq: Once | INTRAMUSCULAR | Status: AC
  Administered 2016-11-28: 25 mg via INTRAVENOUS
  Filled 2016-11-28: qty 1

## 2016-11-28 MED ORDER — KETOROLAC TROMETHAMINE 30 MG/ML IJ SOLN
15.0000 mg | Freq: Once | INTRAMUSCULAR | Status: AC
  Administered 2016-11-28: 15 mg via INTRAVENOUS
  Filled 2016-11-28: qty 1

## 2016-11-28 MED ORDER — PROCHLORPERAZINE EDISYLATE 5 MG/ML IJ SOLN
10.0000 mg | Freq: Once | INTRAMUSCULAR | Status: AC
  Administered 2016-11-28: 10 mg via INTRAVENOUS
  Filled 2016-11-28: qty 2

## 2016-11-28 NOTE — ED Triage Notes (Signed)
Per EMS pt complaint of migraine ongoing for past week; seen here past week for same.

## 2016-11-28 NOTE — ED Provider Notes (Signed)
[facility]-EMERGENCY DEPT Provider Note   CSN: [id number] Arrival date & time: 11/28/16  1439  By signing my name below, I, Doreatha MartinEva Mathews, attest that this documentation has been prepared under the direction and in the presence of Gerhard Munchobert Coco Sharpnack, MD. Electronically Signed: Doreatha MartinEva Mathews, ED Scribe. 11/28/16. 3:12 PM.     History   Chief Complaint Chief Complaint  Patient presents with  . Migraine    HPI Whitney Smith is a 36 y.o. female who presents to the Emergency Department complaining of moderate, daily HA, lasting all day, that began 1.5 weeks ago. Pt also reports facial numbness, decreased vision and a change in the way she perceives color over the last week in the left eye. Pt reports h/o migraines as a teenager, but has had no sustained HA since. No recent changes in lifestyle or medications. Pt was seen in the ED for the same symptoms on 11/23/16, given migraine cocktail and was d/c with instructions to f/u with her neurologist for further work up. Pt was seen by her neurologist, Dr. Leveda AnnaHensel, on 11/25/16, who ordered head CT and gave pt rx for Ultram and Flexeril. Pt states these medications have not relieved her pain adequately. She denies fever, vomiting, unilateral weakness or numbness.  The history is provided by the patient. No language interpreter was used.    Past Medical History:  Diagnosis Date  . Endometriosis   . Headache(784.0)   . Kidney infection   . PCOS (polycystic ovarian syndrome)     Patient Active Problem List   Diagnosis Date Noted  . 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 Left facial numbness 11/25/2016  . Abdominal pain 07/13/2015  . S/P hysterectomy 11/22/2013  . Endometriosis 11/22/2013  . Onychomycosis 11/22/2013  . Insomnia 11/22/2013  . Herpes simplex 11/22/2013  . Screening cholesterol level 11/22/2013  . Routine general medical examination at a health care facility  11/22/2013  . Breast pain 11/22/2013  . OBESITY, NOS 12/23/2006  . CONSTIPATION 12/23/2006  . 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 ABDOMINAL HYSTERECTOMY    . ENDOMETRIAL ABLATION    . TUBAL LIGATION      OB History    Gravida Para Term Preterm AB Living   3 2 2   1 2    SAB TAB Ectopic Multiple Live Births   1               Home Medications    Prior to Admission medications   Medication Sig Start Date End Date Taking? Authorizing Provider  cyclobenzaprine (FLEXERIL) 10 MG tablet Take 1 tablet (10 mg total) by mouth 3 (three) times daily as needed for muscle spasms. 11/25/16   Moses MannersWilliam A Hensel, MD  ketorolac (TORADOL) 10 MG tablet Take 1 tablet (10 mg total) by mouth every 6 (six) hours as needed. 11/25/16   Moses MannersWilliam A Hensel, MD  traMADol (ULTRAM) 50 MG tablet Take 1 tablet (50 mg total) by mouth every 8 (eight) hours as needed. 11/25/16   Moses MannersWilliam A Hensel, MD  valACYclovir (VALTREX) 1000 MG tablet Take 1,000 mg by mouth daily as needed. outbreaks 11/09/16   Historical Provider, MD    Family History Family History  Problem Relation Age of Onset  . Cancer Mother   . Diabetes Father   . Diabetes Maternal Grandmother   . Cancer Maternal Grandmother     Social  History Social History  Substance Use Topics  . Smoking status: Never Smoker  . Smokeless tobacco: Never Used  . Alcohol use No     Allergies   Penicillins; Sulfamethoxazole; and Codeine phosphate   Review of Systems Review of Systems  Constitutional:       Per HPI, otherwise negative  HENT:       Per HPI, otherwise negative  Respiratory:       Per HPI, otherwise negative  Cardiovascular:       Per HPI, otherwise negative  Gastrointestinal: Negative for vomiting.  Endocrine:       Negative aside from HPI  Genitourinary:       Neg aside from HPI   Musculoskeletal:       Per HPI, otherwise negative  Skin: Negative.   Neurological:  Positive for headaches. Negative for dizziness, seizures, syncope, weakness and light-headedness.     Physical Exam Updated Vital Signs BP 149/72 (BP Location: Right Arm)   Pulse 78   Temp 97.4 F (36.3 C) (Oral)   Resp 16   SpO2 98%   Physical Exam  Constitutional: She is oriented to person, place, and time. She appears well-developed and well-nourished.  Uncomfortable appearing obese F, awake, alert, speaking clearly  HENT:  Head: Normocephalic and atraumatic.  Eyes: Conjunctivae and EOM are normal.  Cardiovascular: Normal rate and regular rhythm.   Pulmonary/Chest: Effort normal and breath sounds normal. No stridor. No respiratory distress.  Abdominal: She exhibits no distension.  Musculoskeletal: She exhibits no edema.  Neurological: She is alert and oriented to person, place, and time. She displays no atrophy and no tremor. No cranial nerve deficit. She exhibits normal muscle tone. She displays no seizure activity. Coordination normal.  Equal grip strength to BUE.   Skin: Skin is warm and dry.  Psychiatric: She has a normal mood and affect.  Nursing note and vitals reviewed.    ED Treatments / Results   DIAGNOSTIC STUDIES: Oxygen Saturation is 98% on RA, normal by my interpretation.    COORDINATION OF CARE: 2:55 PM Discussed treatment plan with pt at bedside which includes pain management and pt agreed to plan.   Patient's chart includes recent evaluation by her primary care physician with plan MRI, MRA scheduled for February 7. CT scan also scheduled. Today, MRI is not available complication month CT scan to exclude mass, hemorrhage.  Radiology Ct Head Wo Contrast  Result Date: 11/28/2016 CLINICAL DATA:  Atypical headache. Left facial dysesthesias. No reported injury. EXAM: CT HEAD WITHOUT CONTRAST TECHNIQUE: Contiguous axial images were obtained from the base of the skull through the vertex without intravenous contrast. COMPARISON:  None. FINDINGS: Brain: No  evidence of parenchymal hemorrhage or extra-axial fluid collection. No mass lesion, mass effect, or midline shift. No CT evidence of acute infarction. Cerebral volume is age appropriate. No ventriculomegaly. Vascular: No hyperdense vessel or unexpected calcification. Skull: No evidence of calvarial fracture. Sinuses/Orbits: The visualized paranasal sinuses are essentially clear. Other:  The mastoid air cells are unopacified. IMPRESSION: Negative head CT.  No evidence of acute intracranial abnormality. Electronically Signed   By: Delbert Phenix M.D.   On: 11/28/2016 16:06    Procedures Procedures (including critical care time)  Medications Ordered in ED Medications  sodium chloride 0.9 % bolus 1,000 mL (0 mLs Intravenous Stopped 11/28/16 1728)  ketorolac (TORADOL) 30 MG/ML injection 15 mg (15 mg Intravenous Given 11/28/16 1558)  prochlorperazine (COMPAZINE) injection 10 mg (10 mg Intravenous Given 11/28/16 1555)  diphenhydrAMINE (BENADRYL)  injection 25 mg (25 mg Intravenous Given 11/28/16 1557)  dexamethasone (DECADRON) injection 10 mg (10 mg Intravenous Given 11/28/16 1558)   5:56 PM On repeat exam the patient continues to have some discomfort, but declines offer for additional analgesia. I discussed all findings, with the patient and to companion. We discussed importance of following up with primary care, having the scheduled MRI performed. Absent evidence for neurovascular compromise, neurologic dysfunction, distress, the patient does have some ongoing discomfort, the patient was discharged in stable condition with return precautions, follow-up instructions.   Initial Impression / Assessment and Plan / ED Course  I have reviewed the triage vital signs and the nursing notes.  Pertinent labs & imaging results that were available during my care of the patient were reviewed by me and considered in my medical decision making (see chart for details).   I personally performed the services described in this  documentation, which was scribed in my presence. The recorded information has been reviewed and is accurate.       Gerhard Munch, MD 11/28/16 [phone]

## 2016-11-28 NOTE — Discharge Instructions (Signed)
As discussed, your evaluation today has been largely reassuring.  But, it is important that you monitor your condition carefully, and do not hesitate to return to the ED if you develop new, or concerning changes in your condition. ? ?Otherwise, please follow-up with your physician for appropriate ongoing care. ? ?

## 2016-12-02 ENCOUNTER — Ambulatory Visit (HOSPITAL_COMMUNITY)
Admission: RE | Admit: 2016-12-02 | Discharge: 2016-12-02 | Disposition: A | Discharge status: Home / Self Care | Payer: Managed Care, Other (non HMO) | Source: Ambulatory Visit | Attending: Family Medicine | Admitting: Family Medicine

## 2016-12-02 ENCOUNTER — Other Ambulatory Visit: Payer: Self-pay | Admitting: Family Medicine

## 2016-12-02 DIAGNOSIS — G43009 Migraine without aura, not intractable, without status migrainosus: Secondary | ICD-10-CM | POA: Diagnosis present

## 2016-12-02 DIAGNOSIS — G44209 Tension-type headache, unspecified, not intractable: Secondary | ICD-10-CM | POA: Diagnosis present

## 2016-12-02 DIAGNOSIS — R200 Anesthesia of skin: Secondary | ICD-10-CM | POA: Diagnosis not present

## 2016-12-02 MED ORDER — OXYCODONE-ACETAMINOPHEN 5-325 MG PO TABS: 1.0000 | ORAL_TABLET | ORAL | 0 refills | Status: DC | PRN

## 2016-12-02 NOTE — Telephone Encounter (Signed)
Called and informed MRI normal. I will give 3 days worth of percocet for acute pain.  She will pick up Rx.

## 2016-12-02 NOTE — Telephone Encounter (Signed)
Order had to be changed.  [place] does not do MRA with Contrast.  Order changed to MRA without from MRA with/without.  Melvenia BeamShari, CMA will inform Dr. Leveda AnnaHensel. Bracha Frankowski, Maryjo RochesterJessica Dawn, CMA

## 2016-12-02 NOTE — Addendum Note (Signed)
Addended by: Jone BasemanFLEEGER, Jamison Soward D on: 12/02/2016 09:06 AM   Modules accepted: Orders

## 2016-12-02 NOTE — Addendum Note (Signed)
Addended by: Moses MannersHENSEL, Floretta Petro A on: 12/02/2016 11:31 AM   Modules accepted: Orders

## 2016-12-02 NOTE — Telephone Encounter (Signed)
Dr. Leveda AnnaHensel informed. Whitney SpillersSharon T Dartanyan Deasis, CMA

## 2017-01-20 ENCOUNTER — Ambulatory Visit (INDEPENDENT_AMBULATORY_CARE_PROVIDER_SITE_OTHER): Payer: Managed Care, Other (non HMO) | Admitting: Family Medicine

## 2017-01-20 ENCOUNTER — Encounter: Payer: Self-pay | Admitting: Family Medicine

## 2017-01-20 VITALS — BP 128/78 | HR 80 | Temp 98.0°F | Ht 67.0 in | Wt 248.4 lb

## 2017-01-20 DIAGNOSIS — R21 Rash and other nonspecific skin eruption: Secondary | ICD-10-CM

## 2017-01-20 DIAGNOSIS — G44209 Tension-type headache, unspecified, not intractable: Secondary | ICD-10-CM

## 2017-01-20 DIAGNOSIS — E669 Obesity, unspecified: Secondary | ICD-10-CM

## 2017-01-20 DIAGNOSIS — Z23 Encounter for immunization: Secondary | ICD-10-CM

## 2017-01-20 DIAGNOSIS — L299 Pruritus, unspecified: Secondary | ICD-10-CM | POA: Insufficient documentation

## 2017-01-20 DIAGNOSIS — Z90710 Acquired absence of both cervix and uterus: Secondary | ICD-10-CM

## 2017-01-20 DIAGNOSIS — Z13220 Encounter for screening for lipoid disorders: Secondary | ICD-10-CM

## 2017-01-20 DIAGNOSIS — G43009 Migraine without aura, not intractable, without status migrainosus: Secondary | ICD-10-CM

## 2017-01-20 DIAGNOSIS — E282 Polycystic ovarian syndrome: Secondary | ICD-10-CM | POA: Insufficient documentation

## 2017-01-20 MED ORDER — CLOTRIMAZOLE-BETAMETHASONE 1-0.05 % EX LOTN: TOPICAL_LOTION | Freq: Two times a day (BID) | CUTANEOUS | 0 refills | Status: DC

## 2017-01-20 MED ORDER — METFORMIN HCL ER 500 MG PO TB24: 500.0000 mg | ORAL_TABLET | Freq: Every day | ORAL | 12 refills | Status: DC

## 2017-01-20 MED ORDER — METFORMIN HCL ER 500 MG PO TB24: 500.0000 mg | ORAL_TABLET | Freq: Every day | ORAL | Status: DC

## 2017-01-20 MED ORDER — TOPIRAMATE 50 MG PO TABS
50.0000 mg | ORAL_TABLET | Freq: Two times a day (BID) | ORAL | 12 refills | Status: DC
Start: 2017-01-20 — End: 2018-02-24

## 2017-01-20 NOTE — Assessment & Plan Note (Signed)
Topimax for prophylaxis.

## 2017-01-20 NOTE — Addendum Note (Signed)
Addended by: Georges LynchSAUNDERS, Catherin Doorn T on: 01/20/2017 10:01 AM   Modules accepted: Orders, SmartSet

## 2017-01-20 NOTE — Assessment & Plan Note (Signed)
Check TSH.  Continue diet and exercise.

## 2017-01-20 NOTE — Assessment & Plan Note (Signed)
Patient informed no cervix.  No need for paps.

## 2017-01-20 NOTE — Assessment & Plan Note (Signed)
Told this by former Ob.  Tried on high dose metformin and diarrhea.  Will try low dose.

## 2017-01-20 NOTE — Patient Instructions (Addendum)
You no longer have a cervix - it came out with your hysterectomy.  No more pap smears. You have a plugged gland on your vaginal lips.  No treatment needed. Ear: Use some OTC hydrocortisone twice a day.  You can even use a Q tip to put it a little on the inside.  Not deep.  Quit scratching.   Scalp: I am not sure.  Try my old time, cure all ointment.  Again, don't scratch. I will call with lab results. I am starting you on topiramate=topimax for migraine prevention.  I am starting on a mild dose.  I also sent in a prescription for metformin.  Start slow and build up.

## 2017-01-20 NOTE — Assessment & Plan Note (Signed)
Non specific.  Try topical steroid + antifungal.

## 2017-01-20 NOTE — Progress Notes (Signed)
   Subjective:    Patient ID: Whitney Smith, female    DOB: 09/16/1981, 36 y.o.   MRN: [id number]  HPI Multiple issues: 1. HA.  Not currently on any meds.  Having 2 per week and they are bad.  With her FHx of drug abuse, she wants to avoid controled substances.  Reviewed nl CT and MRI recently. 2. Obesity.  Trend down.  Is tired and some constipation.  No recent TSH. 3. Possible need for cervical cancer screen.  Knows she has had a hysterectomy and still has ovaries.  Thinks she has cervix.  Hysterectomy done abdominally. Reviewed records.  Cannot find op note.  Can find two vag probe ultrasounds which confirm ovaries in situ.  No mention of cervix.  "uterus surgically absent." 4. Rash on neck in occipital hair.  Seen derm, who treated with expensive oral med without benefit.  Intensely pruritic.\ 5. Pruritis of right ear canal. 6. Knot on vaginal lip      Review of Systems     Objective:   Physical Exam  In occiptal hair, non specific excoriated flaking.  No hair loss.  Does not fluoresce  Right ear canal, non specific flaking. Pelvic: Small cyst near post forchette on right.  Vagina, normal, no uterus seen.  Bimanual normal.  No cervix felt.  Did feel vaginal cuff.        Assessment & Plan:

## 2017-01-20 NOTE — Assessment & Plan Note (Signed)
Re screen.  Greater than five years.

## 2017-01-21 ENCOUNTER — Encounter: Payer: Self-pay | Admitting: Family Medicine

## 2017-01-21 ENCOUNTER — Telehealth: Payer: Self-pay | Admitting: *Deleted

## 2017-01-21 LAB — LIPID PANEL
CHOL/HDL RATIO: 3.4 ratio (ref 0.0–4.4)
Cholesterol, Total: 161 mg/dL (ref 100–199)
HDL: 47 mg/dL (ref >39)
LDL Calculated: 100 mg/dL — ABNORMAL HIGH (ref 0–99)
Triglycerides: 69 mg/dL (ref 0–149)
VLDL CHOLESTEROL CAL: 14 mg/dL (ref 5–40)

## 2017-01-21 LAB — TSH: TSH: 2.5 u[IU]/mL (ref 0.450–4.500)

## 2017-01-21 NOTE — Telephone Encounter (Signed)
Pt was advised. ep °

## 2017-01-21 NOTE — Telephone Encounter (Signed)
Tried again.  Can't leave message.  Mailbox is full.  Labs normal.  Letter sent.

## 2017-01-21 NOTE — Telephone Encounter (Signed)
Patient states she has missed some calls from Dr. Leveda AnnaHensel.  THinks it may be about her lab work.  States Dr.Hensel can leave detailed message on her cell phone voice mail [phone]).  Will route note to Dr. Leveda AnnaHensel.  Whitney Smith~Whitney Smith, BSN, RN-BC

## 2017-02-05 ENCOUNTER — Emergency Department (HOSPITAL_COMMUNITY)
Admission: EM | Admit: 2017-02-05 | Discharge: 2017-02-05 | Disposition: A | Discharge status: Home / Self Care | Payer: Managed Care, Other (non HMO) | Attending: Emergency Medicine | Admitting: Emergency Medicine

## 2017-02-05 ENCOUNTER — Encounter (HOSPITAL_COMMUNITY): Payer: Self-pay | Admitting: Emergency Medicine

## 2017-02-05 DIAGNOSIS — J111 Influenza due to unidentified influenza virus with other respiratory manifestations: Secondary | ICD-10-CM | POA: Insufficient documentation

## 2017-02-05 DIAGNOSIS — R69 Illness, unspecified: Secondary | ICD-10-CM

## 2017-02-05 DIAGNOSIS — R05 Cough: Secondary | ICD-10-CM | POA: Diagnosis present

## 2017-02-05 LAB — RAPID STREP SCREEN (MED CTR MEBANE ONLY): STREPTOCOCCUS, GROUP A SCREEN (DIRECT): NEGATIVE

## 2017-02-05 MED ORDER — OSELTAMIVIR PHOSPHATE 75 MG PO CAPS: 75.0000 mg | ORAL_CAPSULE | Freq: Two times a day (BID) | ORAL | 0 refills | Status: DC

## 2017-02-05 MED ORDER — BENZONATATE 100 MG PO CAPS: 100.0000 mg | ORAL_CAPSULE | Freq: Three times a day (TID) | ORAL | 0 refills | Status: DC

## 2017-02-05 NOTE — ED Triage Notes (Signed)
Patient is complaining of throat, both ears, coughing, and her chest is from her coughing so much. Patient states she has been running fevers.

## 2017-02-05 NOTE — ED Provider Notes (Signed)
[facility]-EMERGENCY DEPT Provider Note   CSN: [id number] Arrival date & time: 02/05/17  0455     History   Chief Complaint Chief Complaint  Patient presents with  . Sore Throat  . Otalgia  . Cough    HPI Whitney Smith is a 36 y.o. female.  Patient presents with two-day history of sore throat, bilateral ear pain, persistent cough, fever to 102F, body aches. Cough is productive of sputum. Sick contacts at home include small child and husband. She works at an [employer] and notes that she has been exposed to influenza and strep throat. Using over-the-counter cough and cold medications at home with some relief. Also using throat lozenges. No vomiting or diarrhea. No chest pain or shortness of breath. Onset of symptoms abrupt. Course is constant.      Past Medical History:  Diagnosis Date  . Endometriosis   . Headache(784.0)   . Kidney infection   . PCOS (polycystic ovarian syndrome)     Patient Active Problem List   Diagnosis Date Noted  . Rash of neck 01/20/2017  . PCOS (polycystic ovarian syndrome) 01/20/2017  . 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 Left facial numbness 11/25/2016  . Abdominal pain 07/13/2015  . S/P hysterectomy 11/22/2013  . Endometriosis 11/22/2013  . Onychomycosis 11/22/2013  . Insomnia 11/22/2013  . Herpes simplex 11/22/2013  . Screening cholesterol level 11/22/2013  . Routine general medical examination at a health care facility 11/22/2013  . Breast pain 11/22/2013  . Obesity 12/23/2006  . CONSTIPATION 12/23/2006  . 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 ABDOMINAL HYSTERECTOMY    . ENDOMETRIAL ABLATION    . TUBAL LIGATION      OB History    Gravida Para Term Preterm AB Living   SAB TAB Ectopic Multiple Live Births   1               Home Medications    Prior to  Admission medications   Medication Sig Start Date End Date Taking? Authorizing Provider  clotrimazole-betamethasone (LOTRISONE) lotion Apply topically 2 (two) times daily. To neck area 01/20/17   Moses Manners, MD  metFORMIN (GLUCOPHAGE-XR) 500 MG 24 hr tablet Take 1 tablet (500 mg total) by mouth daily with breakfast. 01/20/17   Moses Manners, MD  topiramate (TOPAMAX) 50 MG tablet Take 1 tablet (50 mg total) by mouth 2 (two) times daily. 01/20/17   Moses Manners, MD  valACYclovir (VALTREX) 1000 MG tablet Take 1,000 mg by mouth daily as needed. outbreaks 11/09/16   Historical Provider, MD    Family History Family History  Problem Relation Age of Onset  . Cancer Mother   . Diabetes Father   . Diabetes Maternal Grandmother   . Cancer Maternal Grandmother     Social History Social History  Substance Use Topics  . Smoking status: Never Smoker  . Smokeless tobacco: Never Used  . Alcohol use No     Allergies   Penicillins; Sulfamethoxazole; and Codeine phosphate   Review of Systems Review of Systems  Constitutional: Positive for chills, fatigue and fever.  HENT: Positive for congestion and sore throat. Negative for ear pain, rhinorrhea and sinus pressure.   Eyes: Negative for redness.  Respiratory: Positive for cough. Negative for shortness of breath and wheezing.   Gastrointestinal:  Negative for abdominal pain, diarrhea, nausea and vomiting.  Genitourinary: Negative for dysuria.  Musculoskeletal: Positive for myalgias. Negative for neck stiffness.  Skin: Negative for rash.  Neurological: Negative for headaches.  Hematological: Negative for adenopathy.     Physical Exam Updated Vital Signs BP 112/73 (BP Location: Left Arm)   Pulse 84   Temp 97.9 F (36.6 C) (Oral)   Resp 20   Ht  (1.702 m)   Wt 112 kg   SpO2 99%   BMI 38.69 kg/m   Physical Exam  Constitutional: She appears well-developed and well-nourished.  HENT:  Head: Normocephalic and atraumatic.    Right Ear: Tympanic membrane, external ear and ear canal normal.  Left Ear: Tympanic membrane, external ear and ear canal normal.  Nose: Mucosal edema present. No rhinorrhea.  Mouth/Throat: Uvula is midline and mucous membranes are normal. Mucous membranes are not dry. No oral lesions. No trismus in the jaw. No uvula swelling. Posterior oropharyngeal erythema present. No oropharyngeal exudate, posterior oropharyngeal edema or tonsillar abscesses.  Eyes: Conjunctivae are normal. Right eye exhibits no discharge. Left eye exhibits no discharge.  Neck: Normal range of motion. Neck supple.  Cardiovascular: Normal rate, regular rhythm and normal heart sounds.   Pulmonary/Chest: Effort normal and breath sounds normal. No respiratory distress. She has no wheezes. She has no rales.  Persistent cough during exam  Abdominal: Soft. There is no tenderness.  Lymphadenopathy:    She has no cervical adenopathy.  Neurological: She is alert.  Skin: Skin is warm and dry.  Psychiatric: She has a normal mood and affect.  Nursing note and vitals reviewed.    ED Treatments / Results   Procedures Procedures (including critical care time)  Medications Ordered in ED Medications - No data to display   Initial Impression / Assessment and Plan / ED Course  I have reviewed the triage vital signs and the nursing notes.  Pertinent labs & imaging results that were available during my care of the patient were reviewed by me and considered in my medical decision making (see chart for details).     Patient seen and examined. Strep negative.  Vital signs reviewed and are as follows: BP 112/73 (BP Location: Left Arm)   Pulse 84   Temp 97.9 F (36.6 C) (Oral)   Resp 20   Ht  (1.702 m)   Wt 112 kg   SpO2 99%   BMI 38.69 kg/m   5:55 AM Patient discharged to home. Rx tamiflu and tessalon. Encouraged to rest and drink plenty of fluids. Patient told to return to ED or see their primary doctor if their  symptoms worsen, high fever not controlled with tylenol, persistent vomiting, they feel they are dehydrated, or if they have any other concerns. Patient verbalized understanding and agreed with plan.     Final Clinical Impressions(s) / ED Diagnoses   Final diagnoses:  Influenza-like illness   Patient with symptoms consistent with influenza. Vitals are stable, low-grade fever. No signs of dehydration, tolerating PO's. Lungs are clear. Supportive therapy indicated with return if symptoms worsen. Patient counseled.   New Prescriptions New Prescriptions   BENZONATATE (TESSALON) 100 MG CAPSULE    Take 1 capsule (100 mg total) by mouth every 8 (eight) hours.   OSELTAMIVIR (TAMIFLU) 75 MG CAPSULE    Take 1 capsule (75 mg total) by mouth every 12 (twelve) hours.     Renne Crigler, PA-C 02/05/17 1610    Pricilla Loveless, MD 02/05/17 [phone]

## 2017-02-05 NOTE — Discharge Instructions (Signed)
Please read and follow all provided instructions.  Your diagnoses today include:  1. Influenza-like illness    Tests performed today include:  Strep test - negative  Vital signs. See below for your results today.   Medications prescribed:   Tamiflu - medication for influenza  This medication, when taken within the first 48 hours of illness, may help decrease the severity of the flu and cause symptoms to improve approximately 12 hours sooner. About 1 out of 5 patients may have diarrhea and vomiting from this medication.    Tessalon Perles - cough suppressant medication  Take any prescribed medications only as directed.  Home care instructions:  Follow any educational materials contained in this packet. Please continue drinking plenty of fluids. Use over-the-counter cold and flu medications as needed as directed on packaging for symptom relief. You may also use ibuprofen or tylenol as directed on packaging for pain or fever.   BE VERY CAREFUL not to take multiple medicines containing Tylenol (also called acetaminophen). Doing so can lead to an overdose which can damage your liver and cause liver failure and possibly death.   Follow-up instructions: Please follow-up with your primary care provider in the next 3 days for further evaluation of your symptoms.   Return instructions:   Please return to the Emergency Department if you experience worsening symptoms.  Please return if you have a high fever greater than 101 degrees not controlled with over-the-counter medications, persistent vomiting and cannot keep down fluids, or worsening trouble breathing.  Please return if you have any other emergent concerns.  Additional Information:  Your vital signs today were: BP 112/73 (BP Location: Left Arm)    Pulse 84    Temp 97.9 F (36.6 C) (Oral)    Resp 20    Ht  (1.702 m)    Wt 112 kg    SpO2 99%    BMI 38.69 kg/m  If your blood pressure (BP) was elevated above 135/85 this  visit, please have this repeated by your doctor within one month.

## 2017-02-07 LAB — CULTURE, GROUP A STREP (THRC)

## 2017-03-02 ENCOUNTER — Other Ambulatory Visit: Payer: Self-pay | Admitting: Internal Medicine

## 2017-03-02 MED ORDER — IVERMECTIN 3 MG PO TABS: ORAL_TABLET | ORAL | 0 refills | Status: DC

## 2017-03-02 NOTE — Progress Notes (Signed)
Patient's son with scabies. Patient now with lesions also consistent with scabies. Has already used permethrin without resolution of lesions. Will treat both patient and her son with ivermectin 21800mcg/kg today and in 7 days.   Tarri AbernethyAbigail J Lancaster, MD, MPH PGY-2 [facility] Pager [phone]

## 2017-03-04 ENCOUNTER — Other Ambulatory Visit: Payer: Self-pay | Admitting: *Deleted

## 2017-03-04 NOTE — Telephone Encounter (Signed)
Refused pharm request for alprazolam refill.  Patient off for several years.  No anxiety or sleep issues came up during recent visit.  Will need an office visit if she has symptoms she feels require medication.

## 2017-03-24 ENCOUNTER — Encounter: Payer: Self-pay | Admitting: Family Medicine

## 2017-03-24 ENCOUNTER — Ambulatory Visit (INDEPENDENT_AMBULATORY_CARE_PROVIDER_SITE_OTHER): Payer: Managed Care, Other (non HMO) | Admitting: Family Medicine

## 2017-03-24 VITALS — BP 108/74 | HR 71 | Temp 98.3°F | Wt 245.0 lb

## 2017-03-24 DIAGNOSIS — N10 Acute tubulo-interstitial nephritis: Secondary | ICD-10-CM | POA: Diagnosis not present

## 2017-03-24 DIAGNOSIS — R350 Frequency of micturition: Secondary | ICD-10-CM | POA: Diagnosis not present

## 2017-03-24 DIAGNOSIS — N390 Urinary tract infection, site not specified: Secondary | ICD-10-CM | POA: Insufficient documentation

## 2017-03-24 LAB — POCT URINALYSIS DIP (MANUAL ENTRY)
Bilirubin, UA: NEGATIVE
Glucose, UA: NEGATIVE mg/dL
Ketones, POC UA: NEGATIVE mg/dL
Nitrite, UA: NEGATIVE
PH UA: 5.5 (ref 5.0–8.0)
Protein Ur, POC: NEGATIVE mg/dL
RBC UA: NEGATIVE
UROBILINOGEN UA: 0.2 U/dL

## 2017-03-24 LAB — POCT UA - MICROSCOPIC ONLY: Epithelial cells, urine per micros: >20

## 2017-03-24 MED ORDER — CIPROFLOXACIN HCL 500 MG PO TABS: 500.0000 mg | ORAL_TABLET | Freq: Two times a day (BID) | ORAL | 0 refills | Status: DC

## 2017-03-24 MED ORDER — CEPHALEXIN 500 MG PO CAPS: 500.0000 mg | ORAL_CAPSULE | Freq: Two times a day (BID) | ORAL | 0 refills | Status: DC

## 2017-03-24 NOTE — Patient Instructions (Signed)
Pyelonephritis, Adult  Pyelonephritis is a kidney infection. The kidneys are organs that help clean your blood by moving waste out of your blood and into your pee (urine). This infection can happen quickly, or it can last for a long time. In most cases, it clears up with treatment and does not cause other problems.  Follow these instructions at home:  Medicines   · Take over-the-counter and prescription medicines only as told by your doctor.  · Take your antibiotic medicine as told by your doctor. Do not stop taking the medicine even if you start to feel better.  General instructions   · Drink enough fluid to keep your pee clear or pale yellow.  · Avoid caffeine, tea, and carbonated drinks.  · Pee (urinate) often. Avoid holding in pee for long periods of time.  · Pee before and after sex.  · After pooping (having a bowel movement), women should wipe from front to back. Use each tissue only once.  · Keep all follow-up visits as told by your doctor. This is important.  Contact a doctor if:  · You do not feel better after 2 days.  · Your symptoms get worse.  · You have a fever.  Get help right away if:  · You cannot take your medicine or drink fluids as told.  · You have chills and shaking.  · You throw up (vomit).  · You have very bad pain in your side (flank) or back.  · You feel very weak or you pass out (faint).  This information is not intended to replace advice given to you by your health care provider. Make sure you discuss any questions you have with your health care provider.  Document Released: 11/19/2004 Document Revised: 03/19/2016 Document Reviewed: 02/04/2015  Elsevier Interactive Patient Education © 2017 [employer]

## 2017-03-24 NOTE — Progress Notes (Signed)
Subjective:    Patient ID: Whitney Smith , female   DOB: May 01, 1981 , 36 y.o..   MRN: [id number]  HPI  Whitney Smith is here for  Chief Complaint  Patient presents with  . Back Pain    1. BACK PAIN  Back pain began 2 days ago. Pain is described as pressure in the pelvic area and bilateral flanks. Patient has tried Ibuprofen without relief. Pain does not radiate. History of trauma or injury: No Prior history of similar pain: yes with previous UTIs History of cancer: No Weak immune system:  No History of IV drug use: No History of steroid use: No  Symptoms Incontinence of bowel or bladder:  No Numbness of leg: No Fever: No Rest or Night pain: No Weight Loss:  No Rash: No Admits to increased frequency and cloudy urine. No dysuria. No hematuria.   Patient believes a UTI might be causing their pain.  ROS see HPI Smoking Status noted.   Past Medical History: Patient Active Problem List   Diagnosis Date Noted  . UTI (urinary tract infection) 03/24/2017  . Rash of neck 01/20/2017  . PCOS (polycystic ovarian syndrome) 01/20/2017  . 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 Left facial numbness 11/25/2016  . Abdominal pain 07/13/2015  . S/P hysterectomy 11/22/2013  . Endometriosis 11/22/2013  . Onychomycosis 11/22/2013  . Insomnia 11/22/2013  . Herpes simplex 11/22/2013  . Screening cholesterol level 11/22/2013  . Routine general medical examination at a health care facility 11/22/2013  . Breast pain 11/22/2013  . Obesity 12/23/2006  . CONSTIPATION 12/23/2006  . 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 She has never used smokeless tobacco.   Objective:   BP 108/74   Pulse 71   Temp 98.3 F (36.8 C) (Oral)   Wt 245 lb (111.1 kg)   SpO2 98%   BMI 38.37 kg/m  Physical Exam  Gen: NAD, alert,  cooperative with exam, well-appearing Cardiac: Regular rate and rhythm, normal S1/S2, no murmur, no edema, capillary refill brisk  Respiratory: Clear to auscultation bilaterally, no wheezes, non-labored breathing Gastrointestinal: soft, positive suprapubic tenderness, non distended, bowel sounds present MSK: CVA tenderness bilaterally. No midline back tenderness to palpation  Results for orders placed or performed in visit on 03/24/17  POCT urinalysis dipstick  Result Value Ref Range   Color, UA yellow yellow   Clarity, UA cloudy (A) clear   Glucose, UA negative negative mg/dL   Bilirubin, UA negative negative   Ketones, POC UA negative negative mg/dL   Spec Grav, UA >=6.578>=1.030 (A) 1.010 - 1.025   Blood, UA negative negative   pH, UA 5.5 5.0 - 8.0   Protein Ur, POC negative negative mg/dL   Urobilinogen, UA 0.2 0.2 or 1.0 E.U./dL   Nitrite, UA Negative Negative   Leukocytes, UA Small (1+) (A) Negative  POCT UA - Microscopic Only  Result Value Ref Range   WBC, Ur, HPF, POC 1-5    RBC, urine, microscopic NONE    Bacteria, U Microscopic 2+    Epithelial cells, urine per micros >20     Assessment & Plan:  UTI (urinary tract infection) Exam and history most consistent with pyelonephritis. Endorsing increased urinary frequency, bilateral flank and pelvic pain. Otherwise well appearing, all vitals within normal limits. No signs of sepsis.  - Urine Cx pending -  Will treat with Ciprofloxacin 500 mg BID x 7 days -Tylenol/Ibuprofen PRN for pain - Return precautions discussed  Orders Placed This Encounter  Procedures  . Urine culture  . POCT urinalysis dipstick  . POCT UA - Microscopic Only    Anders Simmonds, MD [facility], PGY-2

## 2017-03-24 NOTE — Assessment & Plan Note (Addendum)
Exam and history most consistent with pyelonephritis. Endorsing increased urinary frequency, bilateral flank and pelvic pain. Otherwise well appearing, all vitals within normal limits. No signs of sepsis.  - Urine Cx pending - Will treat with Ciprofloxacin 500 mg BID x 7 days -Tylenol/Ibuprofen PRN for pain - Return precautions discussed

## 2017-05-28 NOTE — Telephone Encounter (Signed)
See note

## 2017-08-13 ENCOUNTER — Ambulatory Visit: Payer: Managed Care, Other (non HMO) | Admitting: Family Medicine

## 2017-09-03 ENCOUNTER — Emergency Department (HOSPITAL_COMMUNITY): Payer: Managed Care, Other (non HMO)

## 2017-09-03 ENCOUNTER — Encounter (HOSPITAL_COMMUNITY): Payer: Self-pay | Admitting: Emergency Medicine

## 2017-09-03 ENCOUNTER — Other Ambulatory Visit: Payer: Self-pay

## 2017-09-03 ENCOUNTER — Emergency Department (HOSPITAL_COMMUNITY)
Admission: EM | Admit: 2017-09-03 | Discharge: 2017-09-04 | Disposition: A | Discharge status: Home / Self Care | Payer: Managed Care, Other (non HMO) | Attending: Emergency Medicine | Admitting: Emergency Medicine

## 2017-09-03 DIAGNOSIS — K2100 Gastro-esophageal reflux disease with esophagitis, without bleeding: Secondary | ICD-10-CM

## 2017-09-03 DIAGNOSIS — Z79899 Other long term (current) drug therapy: Secondary | ICD-10-CM | POA: Insufficient documentation

## 2017-09-03 DIAGNOSIS — R1011 Right upper quadrant pain: Secondary | ICD-10-CM

## 2017-09-03 LAB — COMPREHENSIVE METABOLIC PANEL
ALBUMIN: 4.3 g/dL (ref 3.5–5.0)
ALK PHOS: 63 U/L (ref 38–126)
ALT: 23 U/L (ref 14–54)
AST: 17 U/L (ref 15–41)
Anion gap: 9 (ref 5–15)
BILIRUBIN TOTAL: 0.4 mg/dL (ref 0.3–1.2)
BUN: 16 mg/dL (ref 6–20)
CALCIUM: 9.8 mg/dL (ref 8.9–10.3)
CO2: 26 mmol/L (ref 22–32)
CREATININE: 0.83 mg/dL (ref 0.44–1.00)
Chloride: 102 mmol/L (ref 101–111)
GFR calc Af Amer: >60 mL/min (ref >60)
GLUCOSE: 93 mg/dL (ref 65–99)
POTASSIUM: 4 mmol/L (ref 3.5–5.1)
Sodium: 137 mmol/L (ref 135–145)
TOTAL PROTEIN: 7.7 g/dL (ref 6.5–8.1)

## 2017-09-03 LAB — URINALYSIS, ROUTINE W REFLEX MICROSCOPIC
BACTERIA UA: NONE SEEN
BILIRUBIN URINE: NEGATIVE
Glucose, UA: NEGATIVE mg/dL
HGB URINE DIPSTICK: NEGATIVE
Ketones, ur: NEGATIVE mg/dL
NITRITE: NEGATIVE
PH: 5 (ref 5.0–8.0)
Protein, ur: NEGATIVE mg/dL
SPECIFIC GRAVITY, URINE: 1.019 (ref 1.005–1.030)

## 2017-09-03 LAB — CBC
HEMATOCRIT: 39.3 % (ref 36.0–46.0)
Hemoglobin: 13.8 g/dL (ref 12.0–15.0)
MCH: 30.7 pg (ref 26.0–34.0)
MCHC: 35.1 g/dL (ref 30.0–36.0)
MCV: 87.5 fL (ref 78.0–100.0)
PLATELETS: 248 10*3/uL (ref 150–400)
RBC: 4.49 MIL/uL (ref 3.87–5.11)
RDW: 13.1 % (ref 11.5–15.5)
WBC: 9.7 10*3/uL (ref 4.0–10.5)

## 2017-09-03 LAB — LIPASE, BLOOD: Lipase: 22 U/L (ref 11–51)

## 2017-09-03 MED ORDER — FAMOTIDINE IN NACL 20-0.9 MG/50ML-% IV SOLN
20.0000 mg | Freq: Once | INTRAVENOUS | Status: AC
  Administered 2017-09-03: 20 mg via INTRAVENOUS
  Filled 2017-09-03: qty 50

## 2017-09-03 MED ORDER — GI COCKTAIL ~~LOC~~
30.0000 mL | Freq: Once | ORAL | Status: AC
  Administered 2017-09-03: 30 mL via ORAL
  Filled 2017-09-03: qty 30

## 2017-09-03 MED ORDER — ONDANSETRON HCL 4 MG/2ML IJ SOLN
4.0000 mg | Freq: Once | INTRAMUSCULAR | Status: AC
  Administered 2017-09-03: 4 mg via INTRAVENOUS
  Filled 2017-09-03: qty 2

## 2017-09-03 MED ORDER — SODIUM CHLORIDE 0.9 % IV BOLUS (SEPSIS)
1000.0000 mL | Freq: Once | INTRAVENOUS | Status: AC
  Administered 2017-09-03: 1000 mL via INTRAVENOUS

## 2017-09-03 NOTE — ED Provider Notes (Signed)
[facility]-EMERGENCY DEPT Provider Note   CSN: [id number] Arrival date & time: 09/03/17  7829     History   Chief Complaint Chief Complaint  Patient presents with  . Abdominal Pain    HPI Elis D Kroner is a 36 y.o. female.  Karrine D Chriswell is a 36 y.o. Female who presents to the ED complaining of nausea, and abdominal pain today. Patient reports she began having right upper quadrant and epigastric abdominal pain yesterday.  She reports today she has had nausea with associated bad acid taste in her mouth.  She reports she feels there is acid coming up in her mouth.  She has had no vomiting.  She reports taking ibuprofen today without relief of her symptoms.  She reports she does not take ibuprofen on a regular basis.  She reports several episodes of loose stool today.  No watery diarrhea.  No hematochezia.  Abdominal surgical history includes a hysterectomy.  She denies fevers, coughing, chest pain, shortness of breath, vomiting, hematemesis, hematochezia, urinary symptoms or rashes.   The history is provided by the patient and medical records. No language interpreter was used.  Abdominal Pain   Associated symptoms include diarrhea and nausea. Pertinent negatives include fever, vomiting, dysuria, frequency, hematuria and headaches.    Past Medical History:  Diagnosis Date  . Endometriosis   . Headache(784.0)   . Kidney infection   . PCOS (polycystic ovarian syndrome)     Patient Active Problem List   Diagnosis Date Noted  . UTI (urinary tract infection) 03/24/2017  . Rash of neck 01/20/2017  . PCOS (polycystic ovarian syndrome) 01/20/2017  . 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 Left facial numbness 11/25/2016  . Abdominal pain 07/13/2015  . S/P hysterectomy 11/22/2013  . Endometriosis 11/22/2013  . Onychomycosis 11/22/2013  . Insomnia 11/22/2013  . Herpes simplex  11/22/2013  . Screening cholesterol level 11/22/2013  . Routine general medical examination at a health care facility 11/22/2013  . Breast pain 11/22/2013  . Obesity 12/23/2006  . CONSTIPATION 12/23/2006  . 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 ABDOMINAL HYSTERECTOMY    . ENDOMETRIAL ABLATION    . TUBAL LIGATION      OB History    Gravida Para Term Preterm AB Living   3 2 2   1 2    SAB TAB Ectopic Multiple Live Births   1               Home Medications    Prior to Admission medications   Medication Sig Start Date End Date Taking? Authorizing Provider  ibuprofen (ADVIL,MOTRIN) 200 MG tablet Take 400 mg every 6 (six) hours as needed by mouth for headache, mild pain or moderate pain.   Yes [provider]  benzonatate (TESSALON) 100 MG capsule Take 1 capsule (100 mg total) by mouth every 8 (eight) hours. Patient not taking: Reported on 09/03/2017 02/05/17   Renne Crigler, PA-C  ciprofloxacin (CIPRO) 500 MG tablet Take 1 tablet (500 mg total) by mouth 2 (two) times daily. Patient not taking: Reported on 09/03/2017 03/24/17   Beaulah Dinning, MD  clotrimazole-betamethasone (LOTRISONE) lotion Apply topically 2 (two) times daily. To neck area Patient not taking: Reported on 09/03/2017 01/20/17   Moses Manners, MD  ivermectin (STROMECTOL) 3 MG TABS tablet Take 7.5 tablets (22,500 mcg total) by mouth today  and 7.5 tablets by mouth in 7 days. Patient not taking: Reported on 09/03/2017 03/02/17   Marquette SaaLancaster, Abigail Joseph, MD  metFORMIN (GLUCOPHAGE-XR) 500 MG 24 hr tablet Take 1 tablet (500 mg total) by mouth daily with breakfast. Patient not taking: Reported on 09/03/2017 01/20/17   Moses MannersHensel, Jamaal Bernasconi A, MD  omeprazole (PRILOSEC) 20 MG capsule Take 1 capsule (20 mg total) daily by mouth. 09/04/17   Everlene Farrieransie, Javona Bergevin, PA-C  oseltamivir (TAMIFLU) 75 MG capsule Take 1 capsule (75 mg total) by mouth every 12 (twelve)  hours. Patient not taking: Reported on 09/03/2017 02/05/17   Renne CriglerGeiple, Joshua, PA-C  topiramate (TOPAMAX) 50 MG tablet Take 1 tablet (50 mg total) by mouth 2 (two) times daily. Patient not taking: Reported on 09/03/2017 01/20/17   Moses MannersHensel, Betsie Peckman A, MD  valACYclovir (VALTREX) 1000 MG tablet Take 1,000 mg by mouth daily as needed. outbreaks 11/09/16   [provider]    Family History Family History  Problem Relation Age of Onset  . Cancer Mother   . Diabetes Father   . Diabetes Maternal Grandmother   . Cancer Maternal Grandmother     Social History Social History   Tobacco Use  . Smoking status: Never Smoker  . Smokeless tobacco: Never Used  Substance Use Topics  . Alcohol use: No  . Drug use: No     Allergies   Penicillins; Sulfamethoxazole; and Codeine phosphate   Review of Systems Review of Systems  Constitutional: Negative for chills and fever.  HENT: Negative for congestion.   Eyes: Negative for visual disturbance.  Respiratory: Negative for cough and shortness of breath.   Cardiovascular: Negative for chest pain and palpitations.  Gastrointestinal: Positive for abdominal pain, diarrhea and nausea. Negative for blood in stool and vomiting.  Genitourinary: Negative for decreased urine volume, difficulty urinating, dysuria, frequency, hematuria and urgency.  Musculoskeletal: Negative for back pain and neck pain.  Skin: Negative for rash.  Neurological: Negative for light-headedness and headaches.     Physical Exam Updated Vital Signs BP (!) 118/58 (BP Location: Right Arm)   Pulse 81   Temp 97.8 F (36.6 C) (Oral)   Resp 14   SpO2 98%   Physical Exam  Constitutional: She appears well-developed and well-nourished.  Non-toxic appearance. She does not appear ill. No distress.  HENT:  Head: Normocephalic and atraumatic.  Mouth/Throat: Oropharynx is clear and moist. No oropharyngeal exudate.  Throat is clear.  Eyes: Conjunctivae are normal. Pupils are  equal, round, and reactive to light. Right eye exhibits no discharge. Left eye exhibits no discharge.  Neck: Neck supple.  Cardiovascular: Normal rate, regular rhythm, normal heart sounds and intact distal pulses. Exam reveals no gallop and no friction rub.  No murmur heard. Pulmonary/Chest: Effort normal and breath sounds normal. No respiratory distress. She has no wheezes. She has no rales.  Abdominal: Soft. Normal appearance and bowel sounds are normal. She exhibits no distension and no mass. There is no hepatosplenomegaly. There is tenderness in the right upper quadrant and epigastric area. There is no rigidity, no rebound, no guarding and no CVA tenderness.  Abdomen is soft.  Bowel sounds are present.  Patient has mild epigastric and right upper quadrant abdominal tenderness to palpation.  Musculoskeletal: She exhibits no edema.  Lymphadenopathy:    She has no cervical adenopathy.  Neurological: She is alert. Coordination normal.  Skin: Skin is warm and dry. No rash noted. She is not diaphoretic. No erythema. No pallor.  Psychiatric: She has a normal mood  and affect. Her behavior is normal.  Nursing note and vitals reviewed.    ED Treatments / Results  Labs (all labs ordered are listed, but only abnormal results are displayed) Labs Reviewed  URINALYSIS, ROUTINE W REFLEX MICROSCOPIC - Abnormal; Notable for the following components:      Result Value   APPearance HAZY (*)    Leukocytes, UA MODERATE (*)    Squamous Epithelial / LPF 6-30 (*)    All other components within normal limits  LIPASE, BLOOD  COMPREHENSIVE METABOLIC PANEL  CBC    EKG  EKG Interpretation None       Radiology [place] Abdomen Limited  Result Date: 09/04/2017 CLINICAL DATA:  Right upper quadrant pain EXAM: ULTRASOUND ABDOMEN LIMITED RIGHT UPPER QUADRANT COMPARISON:  None. FINDINGS: Gallbladder: No shadowing stones. Tiny polyps measuring up to 4 mm. Normal wall thickness. Negative sonographic [place]  bile duct: Diameter: 2.8 mm Liver: No focal lesion identified. Within normal limits in parenchymal echogenicity. Portal vein is patent on color Doppler imaging with normal direction of blood flow towards the liver. IMPRESSION: 1. Negative for acute cholecystitis or biliary dilatation 2. Tiny gallbladder polyps Electronically Signed   By: Jasmine PangKim  Fujinaga M.D.   On: 09/04/2017 00:09    Procedures Procedures (including critical care time)  Medications Ordered in ED Medications  sodium chloride 0.9 % bolus 1,000 mL (0 mLs Intravenous Stopped 09/04/17 0106)  ondansetron (ZOFRAN) injection 4 mg (4 mg Intravenous Given 09/03/17 2341)  gi cocktail (Maalox,Lidocaine,Donnatal) (30 mLs Oral Given 09/03/17 2322)  famotidine (PEPCID) IVPB 20 mg premix (0 mg Intravenous Stopped 09/04/17 0011)     Initial Impression / Assessment and Plan / ED Course  I have reviewed the triage vital signs and the nursing notes.  Pertinent labs & imaging results that were available during my care of the patient were reviewed by me and considered in my medical decision making (see chart for details).     This is a 36 y.o. Female who presents to the ED complaining of nausea, and abdominal pain today. Patient reports she began having right upper quadrant and epigastric abdominal pain yesterday.  She reports today she has had nausea with associated bad acid taste in her mouth.  She reports she feels there is acid coming up in her mouth.  She has had no vomiting.  She reports taking ibuprofen today without relief of her symptoms.  She reports she does not take ibuprofen on a regular basis.  She reports several episodes of loose stool today.  No watery diarrhea.  No hematochezia.  Abdominal surgical history includes a hysterectomy. On exam the patient is afebrile and nontoxic-appearing.  Her abdomen is soft and she has epigastric and right upper quadrant tenderness to palpation.  Lungs are clear.  Throat is clear. Blood work here is  reassuring.  CMP shows normal liver enzymes.  It is within normal limits.  CBC is within normal limits.  Lipase is within normal limits. Right upper quadrant ultrasound was obtained which showed no evidence of cholecystitis or biliary dilation.  There are tiny gallbladder polyps. Reevaluation following Pepcid and GI cocktail patient reports her symptoms have resolved.  She has no further abdominal pain.  Tolerating p.o.  She feels ready for discharge.  Suspect GERD with esophagitis.  I discussed diet instructions as well as use of omeprazole and over-the-counter Tums to use as needed.  I encouraged her to follow-up with primary care.  Return precautions discussed. I advised the patient to follow-up  with their primary care provider this week. I advised the patient to return to the emergency department with new or worsening symptoms or new concerns. The patient verbalized understanding and agreement with plan.     Final Clinical Impressions(s) / ED Diagnoses   Final diagnoses:  GERD with esophagitis  Right upper quadrant abdominal pain    ED Discharge Orders        Ordered    omeprazole (PRILOSEC) 20 MG capsule  Daily     09/04/17 0118       Everlene Farrier, PA-C 09/04/17 0125    Rolland Porter, MD 09/12/17 2228

## 2017-09-03 NOTE — ED Triage Notes (Addendum)
Pt complaint of RUQ pain, nausea, coughing up "hunks of stuff that is white," and diarrhea. Onset yesterday.

## 2017-09-04 MED ORDER — OMEPRAZOLE 20 MG PO CPDR: 20.0000 mg | DELAYED_RELEASE_CAPSULE | Freq: Every day | ORAL | 0 refills | Status: DC

## 2017-09-05 ENCOUNTER — Encounter: Payer: Self-pay | Admitting: Family Medicine

## 2018-01-27 ENCOUNTER — Encounter: Payer: Self-pay | Admitting: Family Medicine

## 2018-02-24 ENCOUNTER — Encounter: Payer: Self-pay | Admitting: Family Medicine

## 2018-02-24 ENCOUNTER — Other Ambulatory Visit: Payer: Self-pay

## 2018-02-24 ENCOUNTER — Ambulatory Visit (INDEPENDENT_AMBULATORY_CARE_PROVIDER_SITE_OTHER): Payer: Managed Care, Other (non HMO) | Admitting: Family Medicine

## 2018-02-24 DIAGNOSIS — G43009 Migraine without aura, not intractable, without status migrainosus: Secondary | ICD-10-CM

## 2018-02-24 DIAGNOSIS — G44209 Tension-type headache, unspecified, not intractable: Secondary | ICD-10-CM | POA: Diagnosis not present

## 2018-02-24 DIAGNOSIS — Z13220 Encounter for screening for lipoid disorders: Secondary | ICD-10-CM | POA: Diagnosis not present

## 2018-02-24 DIAGNOSIS — F4323 Adjustment disorder with mixed anxiety and depressed mood: Secondary | ICD-10-CM | POA: Insufficient documentation

## 2018-02-24 DIAGNOSIS — L659 Nonscarring hair loss, unspecified: Secondary | ICD-10-CM | POA: Diagnosis not present

## 2018-02-24 DIAGNOSIS — L299 Pruritus, unspecified: Secondary | ICD-10-CM | POA: Diagnosis not present

## 2018-02-24 NOTE — Progress Notes (Signed)
   Subjective:    Patient ID: Whitney Smith, female    DOB: [birth year], 37 y.o.   MRN: [id number]  HPI  Multiple issues: 1. Obesity.  BMI=39.  Cannot lose despite dilegent efforts.  Affects problem #2. 2. PCOS.  Cannot tolerate metformin.  Causes diarrhea even at very low dose.  Non smoker.  Reluctant to use OCPs (an option)  She failed these before.  Recognizes wt loss is the best answer.   3. Very high stress: Grandmother has cancer.  Just found out husband is cheating on her with a Cabin [employer].  Not sleeping.  Ruminating. 4. Hair loss 5. Scalp itch and flake.  Itching seems to proceed the rash. 6. Having more frequent migraines.    Review of Systems No SI or HI.  S/P hysterectomy     Objective:   Physical Exam SCalp: some excoriation. BMI = 39.  Does have related disorder of PCOS.   Hair loss - not focal.  No obvious areas of allopecia Lungs clear Cardiac RRR without m or g abd bengin.         Assessment & Plan:

## 2018-02-24 NOTE — Patient Instructions (Addendum)
I will call tomorrow with thyroid and cholesterol results. I will likely start with a low dose of doxepin, which may help the sleeping, the head itching and decrease your migraines.  We shall see. I will put in a referral for bariatric surgery Plan on seeing me in one month to keep working on medications.

## 2018-02-24 NOTE — Assessment & Plan Note (Addendum)
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  Doxepin may help.  Labs normal.  Will try empiric doxepin as discussed yesterday.  Rx sent and patient informed.

## 2018-02-24 NOTE — Assessment & Plan Note (Signed)
Check TSH. Likely related to adjustment reaction.

## 2018-02-24 NOTE — Assessment & Plan Note (Signed)
Migraine hx make estrogen for PCOS relatively contraindicated.

## 2018-02-24 NOTE — Assessment & Plan Note (Signed)
Check labs 

## 2018-02-24 NOTE — Assessment & Plan Note (Signed)
Likely will medicate.  Doxepin might help insomnia, migraine, scalp pruritis.

## 2018-02-24 NOTE — Assessment & Plan Note (Signed)
Refer to bariatric surg 

## 2018-02-25 LAB — LIPID PANEL
Chol/HDL Ratio: 3.3 ratio (ref 0.0–4.4)
Cholesterol, Total: 156 mg/dL (ref 100–199)
HDL: 48 mg/dL (ref >39)
LDL Calculated: 93 mg/dL (ref 0–99)
TRIGLYCERIDES: 75 mg/dL (ref 0–149)
VLDL CHOLESTEROL CAL: 15 mg/dL (ref 5–40)

## 2018-02-25 LAB — TSH: TSH: 2.15 u[IU]/mL (ref 0.450–4.500)

## 2018-02-25 MED ORDER — DOXEPIN HCL 25 MG PO CAPS: 25.0000 mg | ORAL_CAPSULE | Freq: Every day | ORAL | 3 refills | Status: DC

## 2018-02-25 NOTE — Addendum Note (Signed)
Addended by: Moses Manners on: 02/25/2018 09:57 AM   Modules accepted: Orders

## 2018-03-03 ENCOUNTER — Emergency Department (HOSPITAL_COMMUNITY): Payer: Managed Care, Other (non HMO)

## 2018-03-03 ENCOUNTER — Encounter (HOSPITAL_COMMUNITY): Payer: Self-pay | Admitting: Emergency Medicine

## 2018-03-03 ENCOUNTER — Emergency Department (HOSPITAL_COMMUNITY)
Admission: EM | Admit: 2018-03-03 | Discharge: 2018-03-03 | Disposition: A | Discharge status: Home / Self Care | Payer: Managed Care, Other (non HMO) | Attending: Emergency Medicine | Admitting: Emergency Medicine

## 2018-03-03 ENCOUNTER — Other Ambulatory Visit: Payer: Self-pay

## 2018-03-03 DIAGNOSIS — R1032 Left lower quadrant pain: Secondary | ICD-10-CM | POA: Diagnosis present

## 2018-03-03 DIAGNOSIS — N201 Calculus of ureter: Secondary | ICD-10-CM | POA: Insufficient documentation

## 2018-03-03 LAB — URINALYSIS, ROUTINE W REFLEX MICROSCOPIC
Bilirubin Urine: NEGATIVE
GLUCOSE, UA: NEGATIVE mg/dL
Ketones, ur: 20 mg/dL — AB
Nitrite: NEGATIVE
PH: 5 (ref 5.0–8.0)
Protein, ur: 30 mg/dL — AB
SPECIFIC GRAVITY, URINE: 1.025 (ref 1.005–1.030)

## 2018-03-03 LAB — BASIC METABOLIC PANEL
ANION GAP: 9 (ref 5–15)
BUN: 17 mg/dL (ref 6–20)
CALCIUM: 8.8 mg/dL — AB (ref 8.9–10.3)
CHLORIDE: 109 mmol/L (ref 101–111)
CO2: 20 mmol/L — AB (ref 22–32)
CREATININE: 0.68 mg/dL (ref 0.44–1.00)
GFR calc Af Amer: >60 mL/min (ref >60)
GFR calc non Af Amer: >60 mL/min (ref >60)
GLUCOSE: 101 mg/dL — AB (ref 65–99)
POTASSIUM: 3.5 mmol/L (ref 3.5–5.1)
Sodium: 138 mmol/L (ref 135–145)

## 2018-03-03 LAB — CBC
HEMATOCRIT: 37.6 % (ref 36.0–46.0)
Hemoglobin: 13 g/dL (ref 12.0–15.0)
MCH: 30 pg (ref 26.0–34.0)
MCHC: 34.6 g/dL (ref 30.0–36.0)
MCV: 86.8 fL (ref 78.0–100.0)
Platelets: 208 10*3/uL (ref 150–400)
RBC: 4.33 MIL/uL (ref 3.87–5.11)
RDW: 13 % (ref 11.5–15.5)
WBC: 7 10*3/uL (ref 4.0–10.5)

## 2018-03-03 LAB — PREGNANCY, URINE: Preg Test, Ur: NEGATIVE

## 2018-03-03 MED ORDER — ONDANSETRON HCL 4 MG/2ML IJ SOLN
4.0000 mg | Freq: Once | INTRAMUSCULAR | Status: AC
  Administered 2018-03-03: 4 mg via INTRAVENOUS
  Filled 2018-03-03: qty 2

## 2018-03-03 MED ORDER — MORPHINE SULFATE (PF) 4 MG/ML IV SOLN
4.0000 mg | INTRAVENOUS | Status: DC | PRN
  Administered 2018-03-03: 4 mg via INTRAVENOUS
  Filled 2018-03-03: qty 1

## 2018-03-03 MED ORDER — KETOROLAC TROMETHAMINE 60 MG/2ML IM SOLN
60.0000 mg | Freq: Once | INTRAMUSCULAR | Status: AC
  Administered 2018-03-03: 60 mg via INTRAMUSCULAR
  Filled 2018-03-03: qty 2

## 2018-03-03 MED ORDER — FENTANYL CITRATE (PF) 100 MCG/2ML IJ SOLN
50.0000 ug | INTRAMUSCULAR | Status: DC | PRN
  Administered 2018-03-03: 50 ug via INTRAVENOUS
  Filled 2018-03-03: qty 2

## 2018-03-03 MED ORDER — ONDANSETRON 8 MG PO TBDP
8.0000 mg | ORAL_TABLET | Freq: Once | ORAL | Status: AC
  Administered 2018-03-03: 8 mg via ORAL
  Filled 2018-03-03: qty 1

## 2018-03-03 MED ORDER — ONDANSETRON 8 MG PO TBDP: 8.0000 mg | ORAL_TABLET | Freq: Three times a day (TID) | ORAL | 0 refills | Status: DC | PRN

## 2018-03-03 MED ORDER — TAMSULOSIN HCL 0.4 MG PO CAPS: 0.4000 mg | ORAL_CAPSULE | Freq: Every day | ORAL | 0 refills | Status: DC

## 2018-03-03 MED ORDER — OXYCODONE-ACETAMINOPHEN 5-325 MG PO TABS: 1.0000 | ORAL_TABLET | ORAL | 0 refills | Status: DC | PRN

## 2018-03-03 MED ORDER — OXYCODONE-ACETAMINOPHEN 5-325 MG PO TABS
2.0000 | ORAL_TABLET | Freq: Once | ORAL | Status: AC
  Administered 2018-03-03: 2 via ORAL
  Filled 2018-03-03: qty 2

## 2018-03-03 NOTE — ED Notes (Signed)
PT C/O IV SITE TO RT HAND. SONYA RN AT BEDSIDE TO REASSESS AND EVALUATE FOR REMOVAL

## 2018-03-03 NOTE — ED Triage Notes (Signed)
Pt reports new onset of left flank pain with radiation into back and abdomen. Pt reports taking  Ibuprofen without any improvement. Pt also reports nausea and vomiting.

## 2018-03-03 NOTE — ED Notes (Signed)
Urine culture sent to lab.

## 2018-03-03 NOTE — ED Provider Notes (Signed)
[facility]-EMERGENCY DEPT Provider Note   CSN: [id number] Arrival date & time: 03/03/18  0455     History   Chief Complaint Chief Complaint  Patient presents with  . Flank Pain    HPI Whitney Smith is a 37 y.o. female.  HPI Patient is a 37 year old female presents the emergency department acute onset left flank with radiation towards her left groin which began this morning.  Her pain is moderate to severe in severity.  She developed nausea vomiting.  No dysuria or urinary frequency.  No history of kidney stones.  Her pain is all left-sided at this time.  No vaginal complaints.   Past Medical History:  Diagnosis Date  . Endometriosis   . Headache(784.0)   . Kidney infection   . PCOS (polycystic ovarian syndrome)     Patient Active Problem List   Diagnosis Date Noted  . Hair loss 02/24/2018  . Adjustment reaction with anxiety and depression 02/24/2018  . Scalp pruritus 01/20/2017  . PCOS (polycystic ovarian syndrome) 01/20/2017  . 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 Left facial numbness 11/25/2016  . S/P hysterectomy 11/22/2013  . Endometriosis 11/22/2013  . Onychomycosis 11/22/2013  . Insomnia 11/22/2013  . Herpes simplex 11/22/2013  . Screening cholesterol level 11/22/2013  . Routine general medical examination at a health care facility 11/22/2013  . Breast pain 11/22/2013  . Morbid obesity (HCC) 12/23/2006  . 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 ABDOMINAL HYSTERECTOMY    . ENDOMETRIAL ABLATION    . TUBAL LIGATION       OB History    Gravida  3   Para  2   Term  2   Preterm      AB  1   Living  2     SAB  1   TAB      Ectopic      Multiple      Live Births               Home Medications    Prior to Admission medications   Medication Sig Start Date End Date  Taking? Authorizing Provider  clotrimazole-betamethasone (LOTRISONE) lotion Apply topically 2 (two) times daily. To neck area Patient not taking: Reported on 09/03/2017 01/20/17   Moses Manners, MD  doxepin (SINEQUAN) 25 MG capsule Take 1 capsule (25 mg total) by mouth at bedtime. 02/25/18   Moses Manners, MD  ibuprofen (ADVIL,MOTRIN) 200 MG tablet Take 400 mg every 6 (six) hours as needed by mouth for headache, mild pain or moderate pain.    [provider]  omeprazole (PRILOSEC) 20 MG capsule Take 1 capsule (20 mg total) daily by mouth. 09/04/17   Everlene Farrier, PA-C  valACYclovir (VALTREX) 1000 MG tablet Take 1,000 mg by mouth daily as needed. outbreaks 11/09/16   [provider]    Family History Family History  Problem Relation Age of Onset  . Cancer Mother   . Diabetes Father   . Diabetes Maternal Grandmother   . Cancer Maternal Grandmother     Social History Social History   Tobacco Use  . Smoking status: Never Smoker  . Smokeless tobacco: Never Used  Substance Use Topics  . Alcohol use: No  . Drug use: No     Allergies   Penicillins; Sulfamethoxazole; Codeine phosphate; and Metformin and related  Review of Systems Review of Systems  All other systems reviewed and are negative.    Physical Exam Updated Vital Signs BP 112/62 (BP Location: Left Arm)   Pulse 64   Temp 98.4 F (36.9 C) (Oral)   Resp 17   Ht  (1.702 m)   Wt 108.9 kg (240 lb)   SpO2 97%   BMI 37.59 kg/m   Physical Exam  Constitutional: She is oriented to person, place, and time. She appears well-developed and well-nourished.  HENT:  Head: Normocephalic.  Eyes: EOM are normal.  Neck: Normal range of motion.  Cardiovascular: Normal rate.  Pulmonary/Chest: Effort normal and breath sounds normal.  Abdominal: Soft. She exhibits no distension. There is no tenderness.  Musculoskeletal: Normal range of motion.  Neurological: She is alert and oriented to person,  place, and time.  Psychiatric: She has a normal mood and affect.  Nursing note and vitals reviewed.    ED Treatments / Results  Labs (all labs ordered are listed, but only abnormal results are displayed) Labs Reviewed  URINALYSIS, ROUTINE W REFLEX MICROSCOPIC - Abnormal; Notable for the following components:      Result Value   APPearance CLOUDY (*)    Hgb urine dipstick LARGE (*)    Ketones, ur 20 (*)    Protein, ur 30 (*)    Leukocytes, UA TRACE (*)    RBC / HPF >50 (*)    Bacteria, UA RARE (*)    All other components within normal limits  BASIC METABOLIC PANEL - Abnormal; Notable for the following components:   CO2 20 (*)    Glucose, Bld 101 (*)    Calcium 8.8 (*)    All other components within normal limits  URINE CULTURE  PREGNANCY, URINE  CBC    EKG None  Radiology Ct Renal Stone Study  Result Date: 03/03/2018 CLINICAL DATA:  37 year old female with a history of left-sided flank pain EXAM: CT ABDOMEN AND PELVIS WITHOUT CONTRAST TECHNIQUE: Multidetector CT imaging of the abdomen and pelvis was performed following the standard protocol without IV contrast. COMPARISON:  CT 07/10/2015 FINDINGS: Lower chest: No acute abnormality. Hepatobiliary: Unremarkable appearance of liver parenchyma. Unremarkable gallbladder Pancreas: Unremarkable pancreas Spleen: Unremarkable spleen Adrenals/Urinary Tract: Unremarkable appearance of the adrenal glands. Unremarkable appearance of the right kidney with no hydronephrosis or nephrolithiasis. Unremarkable course of the right ureter. Left kidney without nephrolithiasis or inflammatory changes. There is trace fullness in the hilum of the right kidney, increased from the comparison CT. No ureteral dilation. There is a tiny stone at the left ureterovesical junction measuring 1 mm-2 mm. Stomach/Bowel: Stomach is within normal limits. Appendix is normal. No evidence of bowel wall thickening, distention, or inflammatory changes. Vascular/Lymphatic: No  significant vascular findings are present. No enlarged abdominal or pelvic lymph nodes. Reproductive: Hysterectomy. Unremarkable appearance of the bilateral adnexa Other: None Musculoskeletal: No acute or significant osseous findings. IMPRESSION: Small left-sided ureterovesical junction stone measuring 1 mm-2 mm. There is very trace dilation of the left collecting system. If there is concern for infection, recommend correlation with urinalysis. Electronically Signed   By: Gilmer Mor D.O.   On: 03/03/2018 07:33    Procedures Procedures (including critical care time)  Medications Ordered in ED Medications  morphine 4 MG/ML injection 4 mg (4 mg Intravenous Given 03/03/18 0714)  ketorolac (TORADOL) injection 60 mg (has no administration in time range)  ondansetron (ZOFRAN-ODT) disintegrating tablet 8 mg (has no administration in time range)  oxyCODONE-acetaminophen (PERCOCET/ROXICET) 5-325 MG per  tablet 2 tablet (has no administration in time range)  ondansetron (ZOFRAN) injection 4 mg (4 mg Intravenous Given 03/03/18 0555)     Initial Impression / Assessment and Plan / ED Course  I have reviewed the triage vital signs and the nursing notes.  Pertinent labs & imaging results that were available during my care of the patient were reviewed by me and considered in my medical decision making (see chart for details).     Patient presented with symptoms consistent with acute renal colic.  CT confirms a distal left ureteral stone.  Clinically no signs to suggest infection.  Urine culture sent.  Patient will receive medications to control her symptoms here in the emergency department and will be discharged home when she is feeling better.  Plan will be discharged home with standard stone precautions and standard medications and urology follow-up.  Questions answered.  Patient and family understand to return to the emergency department for new or worsening symptoms  Final Clinical Impressions(s) / ED  Diagnoses   Final diagnoses:  None    ED Discharge Orders    None       Azalia Bilis, MD 03/03/18 [phone]

## 2018-03-03 NOTE — ED Notes (Signed)
Pt is aware a urine specimen is needed. RN at bedside obtaining labs at this time.

## 2018-03-03 NOTE — ED Notes (Signed)
RX X 3

## 2018-03-04 LAB — URINE CULTURE: Culture: <10000 — AB

## 2018-03-08 ENCOUNTER — Ambulatory Visit (INDEPENDENT_AMBULATORY_CARE_PROVIDER_SITE_OTHER): Payer: Managed Care, Other (non HMO) | Admitting: Family Medicine

## 2018-03-08 ENCOUNTER — Encounter: Payer: Self-pay | Admitting: Family Medicine

## 2018-03-08 VITALS — BP 124/65 | HR 64 | Temp 98.1°F | Wt 253.8 lb

## 2018-03-08 DIAGNOSIS — R300 Dysuria: Secondary | ICD-10-CM

## 2018-03-08 DIAGNOSIS — N200 Calculus of kidney: Secondary | ICD-10-CM

## 2018-03-08 LAB — POCT URINALYSIS DIP (MANUAL ENTRY)
Bilirubin, UA: NEGATIVE
Glucose, UA: NEGATIVE mg/dL
Ketones, POC UA: NEGATIVE mg/dL
Leukocytes, UA: NEGATIVE
Nitrite, UA: NEGATIVE
PH UA: 5.5 (ref 5.0–8.0)
Protein Ur, POC: NEGATIVE mg/dL
RBC UA: NEGATIVE
Spec Grav, UA: 1.015 (ref 1.010–1.025)
UROBILINOGEN UA: 0.2 U/dL

## 2018-03-08 MED ORDER — TAMSULOSIN HCL 0.4 MG PO CAPS: 0.4000 mg | ORAL_CAPSULE | Freq: Every day | ORAL | 0 refills | Status: DC

## 2018-03-08 NOTE — Patient Instructions (Addendum)
It was good to see you today.  Thank you for coming in.  I think you have still a kidney stone   You should be better in several weeks  If you are not better by then or if you have fever or worsening pain then come back to see [place]  Things to do to help you feel better Take ibuprofen with food 3-4 tabs three times a day alternate with tylenol Keep active Drink plenty of fluids   Be Well

## 2018-03-08 NOTE — Progress Notes (Signed)
Subjective  Whitney Smith is a 37 y.o. female is presenting with the following  LEFT SIDED PAIN Diagnosed on Thursday with 2 mm distal renal stone.  Has continued to have pain that seems to be higher on her back and also pain with urinating.  No fever or vomiting or chills or rash or severe pain Taking ibuprofen 600 mg three times a day with tylenol and flomax daily No blood in urine    Chief Complaint noted Review of Symptoms - see HPI PMH - Smoking status noted.  CT noted, Urine culture in ER normal   Objective Vital Signs reviewed BP 124/65 (BP Location: Left Arm, Patient Position: Sitting, Cuff Size: Normal)   Pulse 64   Temp 98.1 F (36.7 C) (Oral)   Wt 253 lb 12.8 oz (115.1 kg)   SpO2 97%   BMI 39.75 kg/m  Alert nad Mild left CVAT able to get up and down from exam table without assistance or distress Skin - normal Lungs:  Normal respiratory effort, chest expands symmetrically. Lungs are clear to auscultation, no crackles or wheezes.   Assessments/Plans  See after visit summary for details of patient instuctions  Renal stone First episode.  Has not passed by symptoms.  No signs of infection.  Can take weeks to pass.  Continue current medications and fluid intake.   Gave refill on tamsulosin.  Contact us if wishes to be referred to urology

## 2018-03-08 NOTE — Assessment & Plan Note (Addendum)
First episode.  Has not passed by symptoms.  No signs of infection.  Can take weeks to pass.  Continue current medications and fluid intake.   Gave refill on tamsulosin.  Contact us if wishes to be referred to urology

## 2018-05-12 ENCOUNTER — Encounter: Payer: Self-pay | Admitting: Family Medicine

## 2018-05-12 ENCOUNTER — Other Ambulatory Visit: Payer: Self-pay

## 2018-05-12 ENCOUNTER — Ambulatory Visit (INDEPENDENT_AMBULATORY_CARE_PROVIDER_SITE_OTHER): Payer: Managed Care, Other (non HMO) | Admitting: Family Medicine

## 2018-05-12 DIAGNOSIS — L299 Pruritus, unspecified: Secondary | ICD-10-CM

## 2018-05-12 DIAGNOSIS — F4323 Adjustment disorder with mixed anxiety and depressed mood: Secondary | ICD-10-CM | POA: Diagnosis not present

## 2018-05-12 DIAGNOSIS — G43009 Migraine without aura, not intractable, without status migrainosus: Secondary | ICD-10-CM | POA: Diagnosis not present

## 2018-05-12 DIAGNOSIS — G4700 Insomnia, unspecified: Secondary | ICD-10-CM

## 2018-05-12 DIAGNOSIS — G44209 Tension-type headache, unspecified, not intractable: Secondary | ICD-10-CM

## 2018-05-12 MED ORDER — LORAZEPAM 0.5 MG PO TABS: 0.5000 mg | ORAL_TABLET | Freq: Every day | ORAL | 1 refills | Status: DC

## 2018-05-12 MED ORDER — FLUOXETINE HCL 20 MG PO TABS: 20.0000 mg | ORAL_TABLET | Freq: Every day | ORAL | 3 refills | Status: DC

## 2018-05-12 MED ORDER — CLOBETASOL PROPIONATE 0.05 % EX SOLN: 1.0000 "application " | Freq: Two times a day (BID) | CUTANEOUS | 2 refills | Status: DC

## 2018-05-12 MED ORDER — CETIRIZINE HCL 10 MG PO TABS: 10.0000 mg | ORAL_TABLET | Freq: Every day | ORAL | 11 refills | Status: DC

## 2018-05-12 NOTE — Patient Instructions (Signed)
I am sorry that your life is a mess. I do encourage you to find time for counseling. Fluoxetine=Prozac should help the depression, anxiety and sleeplessness over time. I also sent in a low dose benzo to help sleep at night.  Hopefully, this is only temporary until the prozac kicks in. I also hope it decreases your migraine frequency. Take over the counter cetirizine=zyrtec for the scalp itching. I also sent in a prescription for steroid drops to help the spot. See me in 6-8 weeks.

## 2018-05-13 NOTE — Assessment & Plan Note (Signed)
Will use benzo temporarily until antidepressant kicks in.

## 2018-05-13 NOTE — Progress Notes (Signed)
   Subjective:    Patient ID: Whitney Smith, female    DOB: Jan 04, 1981, 37 y.o.   MRN: [id number]  HPI  FU multiple issues, most centering around stressful life: 1. Stress.  Husband has been unfaithful with one of her co-workers.  They had a feeble attempt at couple's counseling - he did not want to participate or change.  She has been putting off divorce until son graduates from high school, which he just did.  Now likely headed to the scary unknown of divorce.  Also, is conflicted about her job.  She love the work and has been there of years - but it is incredibly awkward that she sees the woman who had the affair on a daily basis.  Not currently in counseling mainly due to a lack of time.  She knows it is important and has plans and has already identified a therapist.  No SI or HI. 2. Scalp itching without rash.  Did do well on doxepin but she could ultimately not tolerate due to weight gain. 3. Insomnia initally well controled on doxepin, by now it provides only two hours of sleep and she is back awake. 4. Migraines had improved on doxepin but now that she has stopped, they are recurring. 5. Doxepin intollerance.  Caused bothe mental clouding and wt gain.  Does not feel the benefits are worth the side effects.     Review of Systems     Objective:   Physical Exam scalp reexamined, no rash. Affect normal and no tears in office.          Assessment & Plan:

## 2018-05-13 NOTE — Assessment & Plan Note (Signed)
Hopefully the SSRI with result in migraine prophylaxis.

## 2018-05-13 NOTE — Assessment & Plan Note (Signed)
Topical steroids and OTC antihistamine.  Plus treat underlying depression

## 2018-05-13 NOTE — Assessment & Plan Note (Signed)
We decided on prozac to take in the morning because it is activating and is associated with wt loss rather than gain.  Also strongly encouraged counseling.

## 2018-05-24 ENCOUNTER — Ambulatory Visit (HOSPITAL_COMMUNITY)
Admission: EM | Admit: 2018-05-24 | Discharge: 2018-05-24 | Disposition: A | Discharge status: Home / Self Care | Payer: Managed Care, Other (non HMO) | Attending: Family Medicine | Admitting: Family Medicine

## 2018-05-24 ENCOUNTER — Encounter (HOSPITAL_COMMUNITY): Payer: Self-pay | Admitting: Emergency Medicine

## 2018-05-24 DIAGNOSIS — G5602 Carpal tunnel syndrome, left upper limb: Secondary | ICD-10-CM | POA: Diagnosis not present

## 2018-05-24 MED ORDER — MELOXICAM 7.5 MG PO TABS: 7.5000 mg | ORAL_TABLET | Freq: Every day | ORAL | 1 refills | Status: DC

## 2018-05-24 NOTE — ED Provider Notes (Signed)
[facility]    CSN: [id number] Arrival date & time: 05/24/18  7253     History   Chief Complaint Chief Complaint  Patient presents with  . Hand Pain    HPI Whitney Smith is a 37 y.o. female.   Patient complains of numbness tingling and pain in her left hand.  Initially involved second and third fingers and now thumb.  It does wake her up at night.  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  HPI  Past Medical History:  Diagnosis Date  . Endometriosis   . Headache(784.0)   . Kidney infection   . PCOS (polycystic ovarian syndrome)     Patient Active Problem List   Diagnosis Date Noted  . Renal stone 03/08/2018  . Hair loss 02/24/2018  . Adjustment reaction with anxiety and depression 02/24/2018  . Scalp pruritus 01/20/2017  . PCOS (polycystic ovarian syndrome) 01/20/2017  . 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 Left facial numbness 11/25/2016  . S/P hysterectomy 11/22/2013  . Endometriosis 11/22/2013  . Onychomycosis 11/22/2013  . Insomnia 11/22/2013  . Herpes simplex 11/22/2013  . Screening cholesterol level 11/22/2013  . Routine general medical examination at a health care facility 11/22/2013  . Breast pain 11/22/2013  . Morbid obesity (HCC) 12/23/2006  . 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 ABDOMINAL HYSTERECTOMY    . ENDOMETRIAL ABLATION    . TUBAL LIGATION      OB History    Gravida  3   Para  2   Term  2   Preterm      AB  1   Living  2     SAB  1   TAB      Ectopic      Multiple      Live Births               Home Medications    Prior to Admission medications   Medication Sig Start Date End Date Taking? Authorizing Provider  cetirizine (ZYRTEC) 10 MG tablet Take 1 tablet (10 mg total) by mouth daily. 05/12/18   Moses Manners, MD  clobetasol  (TEMOVATE) 0.05 % external solution Apply 1 application topically 2 (two) times daily. 05/12/18   Moses Manners, MD  FLUoxetine (PROZAC) 20 MG tablet Take 1 tablet (20 mg total) by mouth daily. 05/12/18   Moses Manners, MD  ibuprofen (ADVIL,MOTRIN) 200 MG tablet Take 400 mg every 6 (six) hours as needed by mouth for headache, mild pain or moderate pain.    [provider]  LORazepam (ATIVAN) 0.5 MG tablet Take 1 tablet (0.5 mg total) by mouth at bedtime. 05/12/18   Moses Manners, MD  valACYclovir (VALTREX) 1000 MG tablet Take 1,000 mg by mouth daily as needed. outbreaks 11/09/16   [provider]    Family History Family History  Problem Relation Age of Onset  . Cancer Mother   . Diabetes Father   . Diabetes Maternal Grandmother   . Cancer Maternal Grandmother     Social History Social History   Tobacco Use  . Smoking status: Never Smoker  . Smokeless tobacco: Never Used  Substance Use Topics  . Alcohol use: No  . Drug use: No     Allergies   Penicillins; Sulfamethoxazole; Codeine phosphate; and Metformin and related  Review of Systems Review of Systems  Constitutional: Negative for chills and fever.  HENT: Negative for ear pain and sore throat.   Eyes: Negative for pain and visual disturbance.  Respiratory: Negative for cough and shortness of breath.   Cardiovascular: Negative for chest pain and palpitations.  Gastrointestinal: Negative for abdominal pain and vomiting.  Genitourinary: Negative for dysuria and hematuria.  Musculoskeletal: Positive for arthralgias. Negative for back pain.  Skin: Negative for color change and rash.  Neurological: Negative for seizures and syncope.  All other systems reviewed and are negative.    Physical Exam Triage Vital Signs ED Triage Vitals  Enc Vitals Group     BP 05/24/18 0918 140/68     Pulse Rate 05/24/18 0918 60     Resp 05/24/18 0918 18     Temp 05/24/18 0918 98.3 F (36.8 C)     Temp Source  05/24/18 0918 Oral     SpO2 05/24/18 0918 100 %     Weight --      Height --      Head Circumference --      Peak Flow --      Pain Score 05/24/18 0920 6     Pain Loc --      Pain Edu? --      Excl. in GC? --    No data found.  Updated Vital Signs BP 140/68 Comment: reported BP to Nurse Selena Battenharlotte Solomon  Pulse 60   Temp 98.3 F (36.8 C) (Oral)   Resp 18   SpO2 100%   Visual Acuity Right Eye Distance:   Left Eye Distance:   Bilateral Distance:    Right Eye Near:   Left Eye Near:    Bilateral Near:     Physical Exam  Constitutional: She appears well-developed and well-nourished.  Musculoskeletal:  Left hand and wrist: Tinel's sign is negative but Phalen and Finkelstein tests are positive.     UC Treatments / Results  Labs (all labs ordered are listed, but only abnormal results are displayed) Labs Reviewed - No data to display  EKG None  Radiology No results found.  Procedures Procedures (including critical care time)  Medications Ordered in UC Medications - No data to display  Initial Impression / Assessment and Plan / UC Course  I have reviewed the triage vital signs and the nursing notes.  Pertinent labs & imaging results that were available during my care of the patient were reviewed by me and considered in my medical decision making (see chart for details).     Hand pain with tingling numbness.  She has features of both radial tenosynovitis as well as carpal tunnel.  I will treat this with a wrist splint that involves immobilization of the thumb.  Also add meloxicam. Final Clinical Impressions(s) / UC Diagnoses   Final diagnoses:  None   Discharge Instructions   None    ED Prescriptions    None     Controlled Substance Prescriptions [place] Controlled Substance Registry consulted? No   Frederica KusterMiller, Stephen M, MD 05/24/18 [phone]

## 2018-05-24 NOTE — ED Triage Notes (Signed)
PT reports numbness in 2nd and 3rd finger on left hand a few days ago. Severe wrist pain and thumb pain started last night.

## 2018-09-25 ENCOUNTER — Ambulatory Visit (HOSPITAL_COMMUNITY)
Admission: EM | Admit: 2018-09-25 | Discharge: 2018-09-25 | Disposition: A | Discharge status: Home / Self Care | Payer: Managed Care, Other (non HMO) | Attending: Family Medicine | Admitting: Family Medicine

## 2018-09-25 ENCOUNTER — Encounter (HOSPITAL_COMMUNITY): Payer: Self-pay | Admitting: *Deleted

## 2018-09-25 ENCOUNTER — Other Ambulatory Visit: Payer: Self-pay

## 2018-09-25 DIAGNOSIS — B9789 Other viral agents as the cause of diseases classified elsewhere: Secondary | ICD-10-CM | POA: Insufficient documentation

## 2018-09-25 DIAGNOSIS — Z880 Allergy status to penicillin: Secondary | ICD-10-CM | POA: Diagnosis not present

## 2018-09-25 DIAGNOSIS — J029 Acute pharyngitis, unspecified: Secondary | ICD-10-CM | POA: Diagnosis not present

## 2018-09-25 DIAGNOSIS — R51 Headache: Secondary | ICD-10-CM

## 2018-09-25 DIAGNOSIS — J028 Acute pharyngitis due to other specified organisms: Secondary | ICD-10-CM | POA: Diagnosis not present

## 2018-09-25 DIAGNOSIS — Z885 Allergy status to narcotic agent status: Secondary | ICD-10-CM | POA: Diagnosis not present

## 2018-09-25 DIAGNOSIS — Z882 Allergy status to sulfonamides status: Secondary | ICD-10-CM | POA: Insufficient documentation

## 2018-09-25 HISTORY — DX: Migraine, unspecified, not intractable, without status migrainosus: G43.909

## 2018-09-25 LAB — POCT RAPID STREP A: STREPTOCOCCUS, GROUP A SCREEN (DIRECT): NEGATIVE

## 2018-09-25 NOTE — ED Provider Notes (Signed)
[facility]    CSN: [id number] Arrival date & time: 09/25/18  1004     History   Chief Complaint Chief Complaint  Patient presents with  . Headache  . Sore Throat    HPI Whitney Smith is a 37 y.o. female.   Patient is a 37 year old female presents for headache, sore throat, ear fullness and discomfort.  The headache started yesterday.  She thought this was a typical migraine.  But then later that evening the sore throat and ear pressure started.  Her symptoms have been constant and remain the same.  She has been taking over-the-counter cold medications without much relief of her symptoms.  She is unsure of fevers but has had some chills and sweating.  Mild body aches.  Denies any cough but has had some postnasal drip.   ROS per HPI    Headache  Sore Throat  Associated symptoms include headaches.    Past Medical History:  Diagnosis Date  . Endometriosis   . Headache(784.0)   . Kidney infection   . Migraines   . PCOS (polycystic ovarian syndrome)     Patient Active Problem List   Diagnosis Date Noted  . Renal stone 03/08/2018  . Hair loss 02/24/2018  . Adjustment reaction with anxiety and depression 02/24/2018  . Scalp pruritus 01/20/2017  . PCOS (polycystic ovarian syndrome) 01/20/2017  . 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 Left facial numbness 11/25/2016  . S/P hysterectomy 11/22/2013  . Endometriosis 11/22/2013  . Onychomycosis 11/22/2013  . Insomnia 11/22/2013  . Herpes simplex 11/22/2013  . Screening cholesterol level 11/22/2013  . Routine general medical examination at a health care facility 11/22/2013  . Breast pain 11/22/2013  . Morbid obesity (HCC) 12/23/2006  . 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 ABDOMINAL HYSTERECTOMY    . ENDOMETRIAL ABLATION    . TUBAL LIGATION      OB History    Gravida  3   Para  2   Term  2   Preterm      AB  1   Living  2     SAB  1   TAB      Ectopic      Multiple      Live Births               Home Medications    Prior to Admission medications   Medication Sig Start Date End Date Taking? Authorizing Provider  FLUoxetine (PROZAC) 20 MG tablet Take 1 tablet (20 mg total) by mouth daily. 05/12/18  Yes Hensel, Santiago BumpersWilliam A, MD  Topiramate (TOPAMAX PO) Take by mouth as needed.   Yes [provider]  ibuprofen (ADVIL,MOTRIN) 200 MG tablet Take 400 mg every 6 (six) hours as needed by mouth for headache, mild pain or moderate pain.    [provider]  valACYclovir (VALTREX) 1000 MG tablet Take 1,000 mg by mouth daily as needed. outbreaks 11/09/16   [provider]    Family History Family History  Problem Relation Age of Onset  . Cancer Mother   . Diabetes Father   . Diabetes Maternal Grandmother   . Cancer Maternal Grandmother     Social History Social History   Tobacco Use  . Smoking status: Never Smoker  . Smokeless tobacco: Never Used  Substance Use Topics  . Alcohol use:  No  . Drug use: No     Allergies   Penicillins; Sulfamethoxazole; Codeine phosphate; and Metformin and related   Review of Systems Review of Systems  Neurological: Positive for headaches.     Physical Exam Triage Vital Signs ED Triage Vitals  Enc Vitals Group     BP 09/25/18 1018 113/82     Pulse Rate 09/25/18 1018 94     Resp 09/25/18 1018 18     Temp 09/25/18 1018 98.2 F (36.8 C)     Temp Source 09/25/18 1018 Oral     SpO2 09/25/18 1018 95 %     Weight --      Height --      Head Circumference --      Peak Flow --      Pain Score 09/25/18 1020 3     Pain Loc --      Pain Edu? --      Excl. in GC? --    No data found.  Updated Vital Signs BP 113/82   Pulse 94   Temp 98.2 F (36.8 C) (Oral)   Resp 18   SpO2 95%   Visual Acuity Right Eye Distance:   Left Eye Distance:   Bilateral  Distance:    Right Eye Near:   Left Eye Near:    Bilateral Near:     Physical Exam  Constitutional: She appears well-developed.  Non-toxic appearance. She does not appear ill.  HENT:  Head: Normocephalic.  Bilateral TMs normal. Postnasal drip noted without tonsillar swelling or exudates. Bilateral nasal turbinate swelling with drainage. No adenopathy  Eyes: Pupils are equal, round, and reactive to light.  Neck: Normal range of motion.  Cardiovascular: Normal rate, regular rhythm and normal heart sounds.  Pulmonary/Chest: Effort normal and breath sounds normal.  Musculoskeletal: Normal range of motion.  Neurological: She is alert.  Skin: Skin is warm and dry.  Psychiatric: She has a normal mood and affect.  Nursing note and vitals reviewed.    UC Treatments / Results  Labs (all labs ordered are listed, but only abnormal results are displayed) Labs Reviewed  POCT RAPID STREP A    EKG None  Radiology No results found.  Procedures Procedures (including critical care time)  Medications Ordered in UC Medications - No data to display  Initial Impression / Assessment and Plan / UC Course  I have reviewed the triage vital signs and the nursing notes.  Pertinent labs & imaging results that were available during my care of the patient were reviewed by me and considered in my medical decision making (see chart for details).     Rapid strep negative Most likely viral illness Over-the-counter symptomatic treatment Follow up as needed for continued or worsening symptoms  Final Clinical Impressions(s) / UC Diagnoses   Final diagnoses:  Viral pharyngitis     Discharge Instructions     Your strep test was negative we will send for culture This is most likely viral illness You can treat the congestion with over-the-counter Mucinex You could even benefit from some Flonase nasal spray Follow up as needed for continued or worsening symptoms     ED Prescriptions      None     Controlled Substance Prescriptions [place] Controlled Substance Registry consulted? Not Applicable   Janace Aris, NP 09/25/18 1100

## 2018-09-25 NOTE — ED Triage Notes (Signed)
C/O starting with HA yesterday, followed by very painful sore throat, sweating (states unsure if fevers) and fullness in bilat ears.

## 2018-09-25 NOTE — Discharge Instructions (Addendum)
Your strep test was negative we will send for culture This is most likely viral illness You can treat the congestion with over-the-counter Mucinex You could even benefit from some Flonase nasal spray Follow up as needed for continued or worsening symptoms

## 2018-09-27 ENCOUNTER — Telehealth (HOSPITAL_COMMUNITY): Payer: Self-pay | Admitting: *Deleted

## 2018-09-27 LAB — CULTURE, GROUP A STREP (THRC)

## 2018-09-30 ENCOUNTER — Encounter (HOSPITAL_COMMUNITY): Payer: Self-pay

## 2018-09-30 ENCOUNTER — Ambulatory Visit (HOSPITAL_COMMUNITY)
Admission: EM | Admit: 2018-09-30 | Discharge: 2018-09-30 | Disposition: A | Discharge status: Home / Self Care | Payer: Managed Care, Other (non HMO) | Attending: Family Medicine | Admitting: Family Medicine

## 2018-09-30 DIAGNOSIS — J0110 Acute frontal sinusitis, unspecified: Secondary | ICD-10-CM

## 2018-09-30 DIAGNOSIS — R059 Cough, unspecified: Secondary | ICD-10-CM

## 2018-09-30 DIAGNOSIS — R05 Cough: Secondary | ICD-10-CM | POA: Diagnosis not present

## 2018-09-30 MED ORDER — FLUCONAZOLE 150 MG PO TABS: 150.0000 mg | ORAL_TABLET | Freq: Once | ORAL | 0 refills | Status: AC

## 2018-09-30 MED ORDER — DOXYCYCLINE HYCLATE 100 MG PO CAPS: 100.0000 mg | ORAL_CAPSULE | Freq: Two times a day (BID) | ORAL | 0 refills | Status: AC

## 2018-09-30 MED ORDER — BENZONATATE 100 MG PO CAPS: 100.0000 mg | ORAL_CAPSULE | Freq: Three times a day (TID) | ORAL | 0 refills | Status: DC | PRN

## 2018-09-30 NOTE — ED Provider Notes (Signed)
[facility]    CSN: [id number] Arrival date & time: 09/30/18  1206     History   Chief Complaint Chief Complaint  Patient presents with  . Cough    HPI Whitney Smith is a 37 y.o. female.   37 year old female presents with worsening of nasal congestion, sore throat and cough that started over 1 week ago. Was seen here 5 days ago and had negative rapid strep test and diagnosed with probable viral illness. Now having more sinus pressure and headaches, chills, body aches, and continued right ear pain. Coughing up dark green mucus. Denies any GI symptoms. Has taken Mucinex and Sudafed with minimal relief. Other chronic health issues include migraine headaches and mood disorder and currently on Prozac daily and Topamax, Valtrex and Ibuprofen prn.   The history is provided by the patient.    Past Medical History:  Diagnosis Date  . Endometriosis   . Headache(784.0)   . Kidney infection   . Migraines   . PCOS (polycystic ovarian syndrome)     Patient Active Problem List   Diagnosis Date Noted  . Renal stone 03/08/2018  . Hair loss 02/24/2018  . Adjustment reaction with anxiety and depression 02/24/2018  . Scalp pruritus 01/20/2017  . PCOS (polycystic ovarian syndrome) 01/20/2017  . 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 Left facial numbness 11/25/2016  . S/P hysterectomy 11/22/2013  . Endometriosis 11/22/2013  . Onychomycosis 11/22/2013  . Insomnia 11/22/2013  . Herpes simplex 11/22/2013  . Screening cholesterol level 11/22/2013  . Routine general medical examination at a health care facility 11/22/2013  . Breast pain 11/22/2013  . Morbid obesity (HCC) 12/23/2006  . 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 ABDOMINAL HYSTERECTOMY    . ENDOMETRIAL ABLATION    . TUBAL LIGATION      OB History    Gravida  3   Para    2   Term  2   Preterm      AB  1   Living  2     SAB  1   TAB      Ectopic      Multiple      Live Births               Home Medications    Prior to Admission medications   Medication Sig Start Date End Date Taking? Authorizing Provider  benzonatate (TESSALON) 100 MG capsule Take 1 capsule (100 mg total) by mouth every 8 (eight) hours as needed for cough. 09/30/18   Sudie Grumbling, NP  doxycycline (VIBRAMYCIN) 100 MG capsule Take 1 capsule (100 mg total) by mouth 2 (two) times daily for 7 days. 09/30/18 10/07/18  Sudie Grumbling, NP  fluconazole (DIFLUCAN) 150 MG tablet Take 1 tablet (150 mg total) by mouth once for 1 dose. Repeat 1 tablet after 3 days and another tablet on last day of antibiotic. 09/30/18 09/30/18  Sudie Grumbling, NP  FLUoxetine (PROZAC) 20 MG tablet Take 1 tablet (20 mg total) by mouth daily. 05/12/18   Moses Manners, MD  ibuprofen (ADVIL,MOTRIN) 200 MG tablet Take 400 mg every 6 (six) hours as needed by mouth for headache, mild pain or moderate pain.    [provider]  Topiramate (TOPAMAX PO) Take by mouth as needed.    [provider]  valACYclovir (VALTREX) 1000 MG tablet Take 1,000 mg by mouth daily as needed. outbreaks 11/09/16   [provider]    Family History Family History  Problem Relation Age of Onset  . Cancer Mother   . Diabetes Father   . Diabetes Maternal Grandmother   . Cancer Maternal Grandmother     Social History Social History   Tobacco Use  . Smoking status: Never Smoker  . Smokeless tobacco: Never Used  Substance Use Topics  . Alcohol use: No  . Drug use: No     Allergies   Penicillins; Sulfamethoxazole; Codeine phosphate; and Metformin and related   Review of Systems Review of Systems  Constitutional: Positive for appetite change, chills and fatigue. Negative for activity change and fever.  HENT: Positive for congestion, ear pain, postnasal drip, rhinorrhea, sinus pressure,  sinus pain and sore throat. Negative for ear discharge, facial swelling, mouth sores, nosebleeds, sneezing and trouble swallowing.   Eyes: Negative for pain, discharge, redness and itching.  Respiratory: Positive for cough and chest tightness. Negative for shortness of breath and wheezing.   Gastrointestinal: Negative for abdominal pain, diarrhea, nausea and vomiting.  Musculoskeletal: Positive for arthralgias. Negative for back pain, myalgias, neck pain and neck stiffness.  Skin: Negative for color change, rash and wound.  Neurological: Positive for headaches. Negative for dizziness, tremors, seizures, syncope, weakness, light-headedness and numbness.  Hematological: Negative for adenopathy. Does not bruise/bleed easily.     Physical Exam Triage Vital Signs ED Triage Vitals  Enc Vitals Group     BP 09/30/18 1240 93/64     Pulse Rate 09/30/18 1240 75     Resp 09/30/18 1240 18     Temp 09/30/18 1240 98.2 F (36.8 C)     Temp Source 09/30/18 1240 Oral     SpO2 09/30/18 1240 98 %     Weight --      Height --      Head Circumference --      Peak Flow --      Pain Score 09/30/18 1241 7     Pain Loc --      Pain Edu? --      Excl. in GC? --    No data found.  Updated Vital Signs BP 93/64 (BP Location: Right Arm)   Pulse 75   Temp 98.2 F (36.8 C) (Oral)   Resp 18   SpO2 98%   Visual Acuity Right Eye Distance:   Left Eye Distance:   Bilateral Distance:    Right Eye Near:   Left Eye Near:    Bilateral Near:     Physical Exam  Constitutional: She is oriented to person, place, and time. Vital signs are normal. She appears well-developed and well-nourished. She is cooperative. She appears ill. No distress.  Patient sitting on exam table comfortably in no acute distress but appears ill.   HENT:  Head: Normocephalic and atraumatic.  Right Ear: Hearing, external ear and ear canal normal. Tympanic membrane is bulging. Tympanic membrane is not injected and not erythematous.    Left Ear: Hearing and ear canal normal. Tympanic membrane is scarred and bulging. Tympanic membrane is not injected and not erythematous.  Nose: Mucosal edema and rhinorrhea present. Right sinus exhibits frontal sinus tenderness. Right sinus exhibits no maxillary sinus tenderness. Left sinus exhibits frontal sinus tenderness. Left sinus exhibits no maxillary sinus tenderness.  Mouth/Throat: Uvula is midline and mucous membranes are normal. Oropharyngeal exudate (yellow post nasal drainage present) and posterior oropharyngeal  erythema present.  Eyes: Conjunctivae and EOM are normal.  Neck: Normal range of motion. Neck supple.  Cardiovascular: Normal rate, regular rhythm and normal heart sounds.  No murmur heard. Pulmonary/Chest: Effort normal. No respiratory distress. She has no decreased breath sounds. She has no wheezes. She has rhonchi (mostly when coughing) in the right upper field and the left upper field. She has no rales.  Musculoskeletal: Normal range of motion.  Lymphadenopathy:       Head (right side): Tonsillar adenopathy present.       Head (left side): Tonsillar adenopathy present.    She has cervical adenopathy.       Right cervical: Superficial cervical adenopathy present.       Left cervical: Superficial cervical adenopathy present.  Neurological: She is alert and oriented to person, place, and time.  Skin: Skin is warm and dry. Capillary refill takes less than 2 seconds. No rash noted.  Psychiatric: She has a normal mood and affect. Her behavior is normal. Judgment and thought content normal.  Vitals reviewed.    UC Treatments / Results  Labs (all labs ordered are listed, but only abnormal results are displayed) Labs Reviewed - No data to display  EKG None  Radiology No results found.  Procedures Procedures (including critical care time)  Medications Ordered in UC Medications - No data to display  Initial Impression / Assessment and Plan / UC Course  I have  reviewed the triage vital signs and the nursing notes.  Pertinent labs & imaging results that were available during my care of the patient were reviewed by me and considered in my medical decision making (see chart for details).    Discussed with patient that she appears to have a sinus infection with cough due to drainage. Recommend start Doxycycline 100mg  twice a day as directed. Take Diflucan 150mg  one tablet today, repeat again in 3 days and on day 7 to prevent antibiotic-induced yeast infection. Take Tessalon cough pills every 8 hours as needed. Increase fluid intake to help loosen up mucus. May continue Mucinex and Sudafed as needed for symptoms. Follow-up in 4 to 5 days if not improving.  Final Clinical Impressions(s) / UC Diagnoses   Final diagnoses:  Acute non-recurrent frontal sinusitis  Cough     Discharge Instructions     Recommend start Doxycycline 100mg  twice a day as directed. Take Diflucan 150mg  tablet today, repeat again in 3 days and again on last day of antibiotic to prevent antibiotic-induced yeast infection. May take Tessalon cough pills 1 every 8 hours as needed. Increase fluid intake to help loosen up mucus. May continue Mucinex and Sudafed as needed for symptoms. Follow-up in 4 to 5 days if not improving.     ED Prescriptions    Medication Sig Dispense Auth. Provider   doxycycline (VIBRAMYCIN) 100 MG capsule Take 1 capsule (100 mg total) by mouth 2 (two) times daily for 7 days. 14 capsule Sudie Grumbling, NP   benzonatate (TESSALON) 100 MG capsule Take 1 capsule (100 mg total) by mouth every 8 (eight) hours as needed for cough. 21 capsule Sudie Grumbling, NP   fluconazole (DIFLUCAN) 150 MG tablet Take 1 tablet (150 mg total) by mouth once for 1 dose. Repeat 1 tablet after 3 days and another tablet on last day of antibiotic. 3 tablet Kaylib Furness, Ali Lowe, NP     Controlled Substance Prescriptions [place] Controlled Substance Registry consulted? Not Applicable   Sudie Grumbling, NP 09/30/18 2223

## 2018-09-30 NOTE — ED Triage Notes (Signed)
Pt presents with persistent ongoing cough with thick yellow & green mucus, chest congestion, chills and body aches .

## 2018-09-30 NOTE — Discharge Instructions (Addendum)
Recommend start Doxycycline 100mg  twice a day as directed. Take Diflucan 150mg  tablet today, repeat again in 3 days and again on last day of antibiotic to prevent antibiotic-induced yeast infection. May take Tessalon cough pills 1 every 8 hours as needed. Increase fluid intake to help loosen up mucus. May continue Mucinex and Sudafed as needed for symptoms. Follow-up in 4 to 5 days if not improving.

## 2018-11-28 ENCOUNTER — Encounter: Payer: Self-pay | Admitting: Family Medicine

## 2018-11-28 ENCOUNTER — Ambulatory Visit (INDEPENDENT_AMBULATORY_CARE_PROVIDER_SITE_OTHER): Payer: Managed Care, Other (non HMO) | Admitting: Family Medicine

## 2018-11-28 VITALS — BP 120/70 | HR 62 | Temp 98.4°F | Wt 259.6 lb

## 2018-11-28 DIAGNOSIS — Z23 Encounter for immunization: Secondary | ICD-10-CM | POA: Diagnosis not present

## 2018-11-28 DIAGNOSIS — L219 Seborrheic dermatitis, unspecified: Secondary | ICD-10-CM | POA: Diagnosis not present

## 2018-11-28 MED ORDER — CLOBETASOL PROPIONATE 0.05 % EX SOLN: 1.0000 "application " | Freq: Two times a day (BID) | CUTANEOUS | 0 refills | Status: DC

## 2018-11-28 MED ORDER — KETOCONAZOLE 2 % EX SHAM: 1.0000 "application " | MEDICATED_SHAMPOO | CUTANEOUS | 0 refills | Status: DC

## 2018-11-28 MED ORDER — MOMETASONE FUROATE 0.1 % EX SOLN: Freq: Every day | CUTANEOUS | 0 refills | Status: DC

## 2018-11-28 NOTE — Progress Notes (Signed)
    Subjective:  Whitney Smith is a 38 y.o. female who presents to the [facility] today with a chief complaint of scalp itching.   HPI:  Patient states that she has had a longstanding issues with her scalp where it itches severely and has flakes and redness with hair loss.  She saw her PCP for this who felt that it may have been due to significant stress in her life as her husband had a fair, her grandmother is dying, and she was having some financial stressors as well. She has been using clobetasol solution which helps.  She uses it for 3 to 4 days when it is at its absolute worst which clears it up.  She then tries to go as long as possible before restarting which is usually about another 3 to 4 days.  She has used dandruff shampoos in the past.  But she has not used it in conjunction with the clobetasol. She has a dermatologist appointment at the end of the month. There was a time where this completely resolved when she was on oral steroids but she does not want to resume those at this time.  ROS: Per HPI   Objective:  Physical Exam: BP 120/70   Pulse 62   Temp 98.4 F (36.9 C) (Oral)   Wt 259 lb 9 oz (117.7 kg)   SpO2 99%   BMI 40.65 kg/m   Gen: NAD, resting comfortably Skin: Fine white diffuse scaliness with prominent erythema especially around the crown and posterior areas.  Slight diffuse hair thinning without bald spots.  No fissures or lesions Neuro: grossly normal, moves all extremities Psych: Normal affect and thought content  Assessment/Plan:  Seborrheic dermatitis of scalp Patient has significant seborrheic dermatitis of her scalp notable for fine flakes with patches of erythema without skin breakdown.  Precepted with Dr. Deirdre Priest.  Discussed ketoconazole 2% shampoo with concurrent use of clobetasol solution to get under control.  Then transition to a lower potency mometasone solution and taper off.  Advised patient to keep her dermatology appointment   Leland Her,  DO PGY-3,  Family Medicine 11/28/2018 11:22 AM

## 2018-11-28 NOTE — Patient Instructions (Addendum)
Use the high potency clobetasol solution with antifungal shampoo ketoconazole to get things under control.  Once scalp feels better. Keep the ketoconazole but switch to lower potency mometasone.  Wean off as you can.

## 2018-11-28 NOTE — Assessment & Plan Note (Signed)
Patient has significant seborrheic dermatitis of her scalp notable for fine flakes with patches of erythema without skin breakdown.  Precepted with Dr. Deirdre Priesthambliss.  Discussed ketoconazole 2% shampoo with concurrent use of clobetasol solution to get under control.  Then transition to a lower potency mometasone solution and taper off.  Advised patient to keep her dermatology appointment

## 2018-12-11 ENCOUNTER — Encounter (HOSPITAL_COMMUNITY): Payer: Self-pay

## 2018-12-11 ENCOUNTER — Other Ambulatory Visit: Payer: Self-pay

## 2018-12-11 ENCOUNTER — Ambulatory Visit (HOSPITAL_COMMUNITY)
Admission: EM | Admit: 2018-12-11 | Discharge: 2018-12-11 | Disposition: A | Discharge status: Home / Self Care | Payer: Managed Care, Other (non HMO) | Attending: Family Medicine | Admitting: Family Medicine

## 2018-12-11 ENCOUNTER — Ambulatory Visit (INDEPENDENT_AMBULATORY_CARE_PROVIDER_SITE_OTHER): Payer: Managed Care, Other (non HMO)

## 2018-12-11 DIAGNOSIS — B9789 Other viral agents as the cause of diseases classified elsewhere: Secondary | ICD-10-CM

## 2018-12-11 DIAGNOSIS — R0602 Shortness of breath: Secondary | ICD-10-CM

## 2018-12-11 DIAGNOSIS — R079 Chest pain, unspecified: Secondary | ICD-10-CM

## 2018-12-11 DIAGNOSIS — J069 Acute upper respiratory infection, unspecified: Secondary | ICD-10-CM

## 2018-12-11 MED ORDER — BENZONATATE 200 MG PO CAPS: 200.0000 mg | ORAL_CAPSULE | Freq: Two times a day (BID) | ORAL | 0 refills | Status: DC | PRN

## 2018-12-11 MED ORDER — DM-GUAIFENESIN ER 30-600 MG PO TB12: 1.0000 | ORAL_TABLET | Freq: Two times a day (BID) | ORAL | 0 refills | Status: DC

## 2018-12-11 MED ORDER — IBUPROFEN 800 MG PO TABS: 800.0000 mg | ORAL_TABLET | Freq: Three times a day (TID) | ORAL | 0 refills | Status: DC

## 2018-12-11 NOTE — ED Triage Notes (Signed)
Pt cc she has SOB and chest pains. X 2 days pt states she has had a fever and congestion. At least a day now. Pt has taken tylenol a hour ago.

## 2018-12-11 NOTE — ED Provider Notes (Signed)
[facility]    CSN: [id number] Arrival date & time: 12/11/18  1006     History   Chief Complaint Chief Complaint  Patient presents with  . Chest Pain    HPI Whitney Smith is a 38 y.o. female.   HPI  Patient is having fever, cough, chest pain.  She describes the chest pain is "severe".  It hurts with deep breath.  It also hurts with coughing.  Sometimes it will hurt without provocation.  She describes the pain is substernal, sharp, severe. No sweats or chills.  Low-grade temperature at home.  She feels like she has some chest congestion that she cannot cough up.  No runny stuffy nose.  No body aches.  No known exposure to influenza  Past Medical History:  Diagnosis Date  . Endometriosis   . Headache(784.0)   . Kidney infection   . Migraines   . PCOS (polycystic ovarian syndrome)     Patient Active Problem List   Diagnosis Date Noted  . Seborrheic dermatitis of scalp 11/28/2018  . Renal stone 03/08/2018  . Hair loss 02/24/2018  . Adjustment reaction with anxiety and depression 02/24/2018  . Scalp pruritus 01/20/2017  . PCOS (polycystic ovarian syndrome) 01/20/2017  . 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 Left facial numbness 11/25/2016  . S/P hysterectomy 11/22/2013  . Endometriosis 11/22/2013  . Onychomycosis 11/22/2013  . Insomnia 11/22/2013  . Herpes simplex 11/22/2013  . Screening cholesterol level 11/22/2013  . Routine general medical examination at a health care facility 11/22/2013  . Breast pain 11/22/2013  . Morbid obesity (HCC) 12/23/2006  . 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 ABDOMINAL HYSTERECTOMY    . ENDOMETRIAL ABLATION    . TUBAL LIGATION      OB History    Gravida  3   Para  2   Term  2   Preterm      AB  1   Living  2     SAB  1   TAB      Ectopic      Multiple        Live Births               Home Medications    Prior to Admission medications   Medication Sig Start Date End Date Taking? Authorizing Provider  benzonatate (TESSALON) 200 MG capsule Take 1 capsule (200 mg total) by mouth 2 (two) times daily as needed for cough. 12/11/18   Eustace Moore, MD  clobetasol (TEMOVATE) 0.05 % external solution Apply 1 application topically 2 (two) times daily. 11/28/18   Leland Her, DO  dextromethorphan-guaiFENesin (MUCINEX DM) 30-600 MG 12hr tablet Take 1 tablet by mouth 2 (two) times daily. 12/11/18   Eustace Moore, MD  FLUoxetine (PROZAC) 20 MG tablet Take 1 tablet (20 mg total) by mouth daily. 05/12/18   Moses Manners, MD  ibuprofen (ADVIL,MOTRIN) 800 MG tablet Take 1 tablet (800 mg total) by mouth 3 (three) times daily. 12/11/18   Eustace Moore, MD  ketoconazole (NIZORAL) 2 % shampoo Apply 1 application topically 2 (two) times a week. 11/28/18   Leland Her, DO  mometasone (ELOCON) 0.1 % lotion Apply topically daily. 11/28/18   Leland Her, DO  Topiramate (TOPAMAX PO) Take by mouth as needed.    [provider]  valACYclovir (VALTREX) 1000 MG tablet Take 1,000 mg by mouth daily as needed. outbreaks 11/09/16   [provider]    Family History Family History  Problem Relation Age of Onset  . Cancer Mother   . Diabetes Father   . Diabetes Maternal Grandmother   . Cancer Maternal Grandmother     Social History Social History   Tobacco Use  . Smoking status: Never Smoker  . Smokeless tobacco: Never Used  Substance Use Topics  . Alcohol use: No  . Drug use: No     Allergies   Penicillins; Sulfamethoxazole; Codeine phosphate; and Metformin and related   Review of Systems Review of Systems  Constitutional: Negative for chills and fever.  HENT: Negative for ear pain and sore throat.   Eyes: Negative for pain and visual disturbance.  Respiratory: Positive for cough. Negative for shortness of breath.    Cardiovascular: Positive for palpitations and leg swelling. Negative for chest pain.  Gastrointestinal: Negative for abdominal pain and vomiting.  Genitourinary: Negative for dysuria and hematuria.  Musculoskeletal: Negative for arthralgias and back pain.  Skin: Negative for color change and rash.  Neurological: Negative for seizures and syncope.  All other systems reviewed and are negative.    Physical Exam Triage Vital Signs ED Triage Vitals  Enc Vitals Group     BP 12/11/18 1028 134/74     Pulse Rate 12/11/18 1028 (!) 116     Resp 12/11/18 1028 18     Temp 12/11/18 1028 100 F (37.8 C)     Temp src --      SpO2 12/11/18 1028 97 %     Weight 12/11/18 1030 255 lb (115.7 kg)     Height --      Head Circumference --      Peak Flow --      Pain Score 12/11/18 1030 7     Pain Loc --      Pain Edu? --      Excl. in GC? --    No data found.  Updated Vital Signs BP 134/74   Pulse (!) 116   Temp 100 F (37.8 C)   Resp 18   Wt 115.7 kg   SpO2 97%   BMI 39.94 kg/m       Physical Exam Constitutional:      General: She is not in acute distress.    Appearance: She is well-developed. She is obese. She is ill-appearing.  HENT:     Head: Normocephalic and atraumatic.     Nose: Nose normal. No congestion.     Mouth/Throat:     Mouth: Mucous membranes are moist.     Pharynx: No posterior oropharyngeal erythema.  Eyes:     Conjunctiva/sclera: Conjunctivae normal.     Pupils: Pupils are equal, round, and reactive to light.  Neck:     Musculoskeletal: Normal range of motion.  Cardiovascular:     Rate and Rhythm: Regular rhythm. Tachycardia present.     Pulses: Normal pulses.     Heart sounds: Normal heart sounds.  Pulmonary:     Effort: Pulmonary effort is normal. No respiratory distress.     Breath sounds: Normal breath sounds.     Comments: No tenderness of the chest wall to palpation Abdominal:     General: There is no distension.     Palpations: Abdomen is  soft.  Musculoskeletal: Normal range of motion.  Skin:    General: Skin is warm and dry.  Neurological:  General: No focal deficit present.     Mental Status: She is alert. Mental status is at baseline.  Psychiatric:        Mood and Affect: Mood normal.        Thought Content: Thought content normal.      UC Treatments / Results  Labs (all labs ordered are listed, but only abnormal results are displayed) Labs Reviewed - No data to display  EKG None  Radiology Dg Chest 2 View  Result Date: 12/11/2018 CLINICAL DATA:  Fever with chest pain and shortness of breath EXAM: CHEST - 2 VIEW COMPARISON:  August 23, 2015 FINDINGS: Lungs are clear. Heart size and pulmonary vascularity are normal. No adenopathy. There is mild degenerative change in the thoracic spine. No pneumothorax. IMPRESSION: No edema or consolidation. Electronically Signed   By: Bretta Bang III M.D.   On: 12/11/2018 11:45    Procedures Procedures (including critical care time)  Medications Ordered in UC Medications - No data to display  Initial Impression / Assessment and Plan / UC Course  I have reviewed the triage vital signs and the nursing notes.  Pertinent labs & imaging results that were available during my care of the patient were reviewed by me and considered in my medical decision making (see chart for details).     EKG and chest x-ray are done because of patient's complaints.  They are both reassuring.  Patient has a viral upper respiratory infection we will treat her symptomatically. Final Clinical Impressions(s) / UC Diagnoses   Final diagnoses:  Viral upper respiratory tract infection with cough     Discharge Instructions     Take ibuprofen 3 times a day with food.  This will reduce chest pain, body aches and fever Take both Tessalon and Mucinex DM every 12 hours Push fluids Use a humidifier if you have one Expect improvement over next few days Follow-up here or with PCP if worse  instead of better, or if you fail to improve   ED Prescriptions    Medication Sig Dispense Auth. Provider   benzonatate (TESSALON) 200 MG capsule Take 1 capsule (200 mg total) by mouth 2 (two) times daily as needed for cough. 20 capsule Eustace Moore, MD   dextromethorphan-guaiFENesin [facility] DM) 30-600 MG 12hr tablet Take 1 tablet by mouth 2 (two) times daily. 20 tablet Eustace Moore, MD   ibuprofen (ADVIL,MOTRIN) 800 MG tablet Take 1 tablet (800 mg total) by mouth 3 (three) times daily. 21 tablet Eustace Moore, MD     Controlled Substance Prescriptions [place] Controlled Substance Registry consulted? Not Applicable   Eustace Moore, MD 12/11/18 2046

## 2018-12-11 NOTE — Discharge Instructions (Signed)
Take ibuprofen 3 times a day with food.  This will reduce chest pain, body aches and fever Take both Tessalon and Mucinex DM every 12 hours Push fluids Use a humidifier if you have one Expect improvement over next few days Follow-up here or with PCP if worse instead of better, or if you fail to improve

## 2018-12-20 ENCOUNTER — Telehealth: Payer: Self-pay | Admitting: Family Medicine

## 2018-12-20 MED ORDER — TRAZODONE HCL 50 MG PO TABS: 50.0000 mg | ORAL_TABLET | Freq: Every evening | ORAL | 1 refills | Status: DC | PRN

## 2018-12-20 NOTE — Telephone Encounter (Signed)
Called because Akeiba's mom informed me that Karmel was going through a tough time.  In addition to other problems, Mariona's grandmother (who raised her and is like a mother to her) is dying of dementia.  Not sleeping.  Having some tachycardia and chest pain during anxious spells.  We decided on trazodone for now.  Grandmother will likely di in the next 1-2 months.  Shalva will stay in touch.

## 2018-12-30 ENCOUNTER — Other Ambulatory Visit: Payer: Self-pay

## 2018-12-30 ENCOUNTER — Ambulatory Visit (INDEPENDENT_AMBULATORY_CARE_PROVIDER_SITE_OTHER): Payer: Managed Care, Other (non HMO) | Admitting: Family Medicine

## 2018-12-30 ENCOUNTER — Telehealth: Payer: Self-pay

## 2018-12-30 VITALS — BP 118/66 | HR 74 | Temp 98.3°F | Wt 260.0 lb

## 2018-12-30 DIAGNOSIS — G514 Facial myokymia: Secondary | ICD-10-CM | POA: Diagnosis not present

## 2018-12-30 DIAGNOSIS — F4323 Adjustment disorder with mixed anxiety and depressed mood: Secondary | ICD-10-CM | POA: Diagnosis not present

## 2018-12-30 NOTE — Assessment & Plan Note (Signed)
  Not consistent with bell's palsy or trigeminal neuralgia. Neurologically in tact. Unclear if this is a drug reaction or related to anxiety. Trazodone side effects do include paresthesias, muscle spasms. She is taking 25 mg every night with some improvement in sleep but due to concern this may be causing this sensation advised her to stop and see if there is improvement. Patient verbalized understanding and agreement with plan.

## 2018-12-30 NOTE — Telephone Encounter (Signed)
Patient called stating she started experiencing some twitching of the left side of her face yesterday and tingling and numbness on the left side of her face today. Was recently started on Trazadone by PCP.  Feels like the left side of her face is "drawing tight" and is numb "like when your foot goes to sleep".  Denies HA, weakness of left side. Denies mouth or facial droop on left side. Speech is clear. Does state she has a significant amount of stress.   Trazadone does list paresthesias as an adverse reaction.  Appointment is available today and patient can leave work to come. Since it is the weekend and PCP not here, and since symptoms are new and concerning, appointment made.  Ples Specter, RN [facility] RN)

## 2018-12-30 NOTE — Progress Notes (Signed)
   Subjective:    Patient ID: Whitney Smith, female    DOB: 03-09-1981, 38 y.o.   MRN: [id number]   CC: facial twitching  HPI: patient reports 1 day of odd sensation in her left cheek. She localizes it to under her eye. She reports twitching that started yesterday. She now feels the area is tingling like it has fallen asleep. She had a migraine Sunday. She is under a lot of stress in her personal life- she has 2 kids, her mother is sick, her close friend passed away. Unsure if this is due to prozac or trazodone (take 1/2 tab at bedtime). Both medications she has been on for the past 2 weeks. She had been taking prozac for months but infrequently not as prescribed. The trazodone was started to help as she was having difficulty sleeping due to severe anxiety. No changes in vision, no facial pain, no recent illnesses, no facial droop, no weakness of extremities  Smoking status reviewed- non-smoker  Review of Systems- see HPI   Objective:  BP 118/66   Pulse 74   Temp 98.3 F (36.8 C) (Oral)   Wt 260 lb (117.9 kg)   BMI 40.72 kg/m  Vitals and nursing note reviewed  General: pleasant, well nourished, in no acute distress HEENT: normocephalic, moist mucous membranes, good dentition without erythema or discharge noted in posterior oropharynx, symmetrical palate rise Neck: supple, non-tender, without lymphadenopathy Cardiac: regular rate Respiratory: no increased work of breathing Skin: warm and dry, no rashes noted  Neuro: alert and oriented, no focal deficits, CN2-12 in tact bilaterally  Psych: mood is anxious, affect congruent  Assessment & Plan:    Adjustment reaction with anxiety and depression  On low dose of prozac with a lot of room for titration up, patient endorsing anxiety and stress in personal life. I encouraged her to follow up with PCP as she is able to discuss titrating up. Encouraged her to consider counseling. She is agreeable to PCP follow up in 1-2 months  Facial  twitching  Not consistent with bell's palsy or trigeminal neuralgia. Neurologically in tact. Unclear if this is a drug reaction or related to anxiety. Trazodone side effects do include paresthesias, muscle spasms. She is taking 25 mg every night with some improvement in sleep but due to concern this may be causing this sensation advised her to stop and see if there is improvement. Patient verbalized understanding and agreement with plan.     Return in about 4 weeks (around 01/27/2019).   Dolores Patty, DO Family Medicine Resident PGY-3

## 2018-12-30 NOTE — Assessment & Plan Note (Signed)
  On low dose of prozac with a lot of room for titration up, patient endorsing anxiety and stress in personal life. I encouraged her to follow up with PCP as she is able to discuss titrating up. Encouraged her to consider counseling. She is agreeable to PCP follow up in 1-2 months

## 2018-12-30 NOTE — Patient Instructions (Addendum)
  Let's stop trazodone and see how you do.   I'd like for you to see your PCP in 1-2 months to discuss anxiety and titrating prozac up but let's take care of this first.  If you have questions or concerns please do not hesitate to call at [phone].  Dolores Patty, DO PGY-3, [facility] 12/30/2018 4:32 PM

## 2019-03-01 ENCOUNTER — Telehealth (INDEPENDENT_AMBULATORY_CARE_PROVIDER_SITE_OTHER): Payer: Managed Care, Other (non HMO) | Admitting: Family Medicine

## 2019-03-01 ENCOUNTER — Other Ambulatory Visit: Payer: Self-pay

## 2019-03-01 ENCOUNTER — Telehealth: Payer: Self-pay | Admitting: *Deleted

## 2019-03-01 ENCOUNTER — Telehealth: Payer: Managed Care, Other (non HMO) | Admitting: Family Medicine

## 2019-03-01 ENCOUNTER — Encounter: Payer: Self-pay | Admitting: Family Medicine

## 2019-03-01 DIAGNOSIS — F4321 Adjustment disorder with depressed mood: Secondary | ICD-10-CM

## 2019-03-01 DIAGNOSIS — Z20828 Contact with and (suspected) exposure to other viral communicable diseases: Secondary | ICD-10-CM

## 2019-03-01 DIAGNOSIS — Z20822 Contact with and (suspected) exposure to covid-19: Secondary | ICD-10-CM

## 2019-03-01 MED ORDER — LORAZEPAM 0.5 MG PO TABS: 0.5000 mg | ORAL_TABLET | Freq: Two times a day (BID) | ORAL | 0 refills | Status: DC | PRN

## 2019-03-01 NOTE — Telephone Encounter (Signed)
LVM to call office back at her earliest convenience, tried to connect with her prior to her visit due to her appointment being right at 1:30pm.  Will try again right before lunch. Robin Zimmerman Rumple, CMA

## 2019-03-01 NOTE — Assessment & Plan Note (Signed)
Given limited exposure, anticipate negative test.

## 2019-03-01 NOTE — Telephone Encounter (Signed)
Pt did return call.Hamdi Zimmerman Rumple, CMA

## 2019-03-01 NOTE — Assessment & Plan Note (Signed)
Normal grief given very close relationship.  No hx of drug misuse.  Will use lorazepam

## 2019-03-01 NOTE — Progress Notes (Signed)
Agrees to video visit CC needs covid testing. Grandmother died of COVID ~1 week ago Grandmother raised her as a mother. No fever, cough, SOB, etc.  No sx to suggest covid.  Exposure was to nurses caring for mom - but with mask and social distancing.   Tragedy is that her grandmother died in isolation. Grieving and not sleeping Would like something to help with sleep. 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  Not yet ready to return.  Grief is too strong.  We agree to a return date of Monday 5/15.  Duration of call 25 minutes.

## 2019-03-02 ENCOUNTER — Other Ambulatory Visit: Payer: Managed Care, Other (non HMO)

## 2019-03-02 ENCOUNTER — Other Ambulatory Visit: Payer: Self-pay

## 2019-03-02 DIAGNOSIS — Z20828 Contact with and (suspected) exposure to other viral communicable diseases: Secondary | ICD-10-CM

## 2019-03-02 DIAGNOSIS — Z20822 Contact with and (suspected) exposure to covid-19: Secondary | ICD-10-CM

## 2019-03-03 LAB — NOVEL CORONAVIRUS, NAA: SARS-CoV-2, NAA: NOT DETECTED

## 2019-05-01 ENCOUNTER — Other Ambulatory Visit: Payer: Self-pay | Admitting: Family Medicine

## 2019-05-01 ENCOUNTER — Telehealth: Payer: Self-pay | Admitting: *Deleted

## 2019-05-01 DIAGNOSIS — F4323 Adjustment disorder with mixed anxiety and depressed mood: Secondary | ICD-10-CM

## 2019-05-01 DIAGNOSIS — B86 Scabies: Secondary | ICD-10-CM | POA: Insufficient documentation

## 2019-05-01 DIAGNOSIS — F4321 Adjustment disorder with depressed mood: Secondary | ICD-10-CM

## 2019-05-01 MED ORDER — PERMETHRIN 5 % EX CREA: 1.0000 "application " | TOPICAL_CREAM | Freq: Once | CUTANEOUS | 0 refills | Status: AC

## 2019-05-01 NOTE — Telephone Encounter (Signed)
Pt lm on nurse that her husband has scabies and was given permethrin.  She states that now her and her son both have some spots and wants to know if there is an oral medication.   Attempted to call back to schedule appt, but went to VM.  Will forward to PCP  Christen Bame, CMA

## 2019-05-01 NOTE — Assessment & Plan Note (Signed)
High risk.  Will treat empirically.

## 2019-05-01 NOTE — Telephone Encounter (Signed)
High risk exposure.  Son and husband both have scabies.  Will treat.

## 2019-05-01 NOTE — Addendum Note (Signed)
Addended by: Zenia Resides on: 05/01/2019 10:27 AM   Modules accepted: Orders

## 2019-06-01 ENCOUNTER — Other Ambulatory Visit: Payer: Self-pay

## 2019-06-01 ENCOUNTER — Telehealth (INDEPENDENT_AMBULATORY_CARE_PROVIDER_SITE_OTHER): Payer: Self-pay | Admitting: Family Medicine

## 2019-06-01 DIAGNOSIS — Z20822 Contact with and (suspected) exposure to covid-19: Secondary | ICD-10-CM

## 2019-06-01 DIAGNOSIS — R6889 Other general symptoms and signs: Secondary | ICD-10-CM

## 2019-06-02 NOTE — Progress Notes (Signed)
[place] Telemedicine Visit  Patient consented to have virtual visit. Method of visit: Telephone  Encounter participants: Patient: Whitney Smith - located at home Provider: Bonnita Hollow - located at office Others (if applicable): Not applicable  Chief Complaint: Close exposure to someone with COVID-19  HPI: Yariela D Leeder is a 38 y.o. female who presents with close exposure to coworker with COVID-19.  Patient works as described as a Psychologist, forensic.  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  Apparently this optometrist is not very observant of the social distancing measures and does not wear a mask.  Patient is very concerned that she might get COVID-19.  Of note, her mother did die of COVID-19 3 months ago.  Patient is asymptomatic and denies any signs of fevers, chills, cough, shortness of breath.  Patient needs also COVID-19 testing to return back to work.   ROS: per HPI  Pertinent PMHx: Noncontributory  Exam:  Respiratory: Able to speak in full sentences without issue Assessment/Plan: Exposure to someone with COVID-19 Asymptomatic now, but did have close exposure.  Recommended self quarantine measures until testing is negative or patient is 14 days symptom-free from known exposure.  Will order testing as requested.  Return precautions given. No problem-specific Assessment & Plan notes found for this encounter.    Time spent during visit with patient: 7 minutes

## 2019-06-03 LAB — NOVEL CORONAVIRUS, NAA: SARS-CoV-2, NAA: NOT DETECTED

## 2019-12-10 ENCOUNTER — Telehealth: Payer: Self-pay | Admitting: Family Medicine

## 2019-12-10 DIAGNOSIS — K5900 Constipation, unspecified: Secondary | ICD-10-CM

## 2019-12-10 NOTE — Telephone Encounter (Signed)
Received call on the emergency line regarding patient's constipation.  Patient reports she started ketogenic diet a few weeks ago.  She has not a bowel movement in 9 days.  Her stomach feels soft but full.  She is passing gas.  She has some low back pain at this time.  She has a history of a prior hysterectomy.  No nausea vomiting or fevers.  She otherwise feels well with able to keep down food and fluid.  She was seen in urgent care yesterday and prescribed GoLYTELY cleanout.  She is completed about a quarter of this.  Recommended she continue with this.  Also recommended senna, increasing fluids and a suppository.  Reviewed reasons to call and return to care.  All questions were answered.  She is to call back tomorrow if her symptoms or not improving.

## 2019-12-11 ENCOUNTER — Other Ambulatory Visit: Payer: Self-pay | Admitting: Family

## 2019-12-11 DIAGNOSIS — K5900 Constipation, unspecified: Secondary | ICD-10-CM

## 2019-12-11 NOTE — Telephone Encounter (Signed)
Patient's mother calls nurse line regarding issues with constipation. States that the goLYTLEY has not helped at all and that patient is still in a great deal of discomfort.Per mother, patient is scheduled for a CT scan Wednesday 2/17, however, mother requests that CT be performed ASAP. I do not see any future orders for this scan.   To PCP  Please advise  Veronda Prude, RN

## 2019-12-11 NOTE — Telephone Encounter (Signed)
Called.  Instructed on the difference between the small, fleets ememas and the large volume enema bags.  She can get a large volume bag at [facility].  Intstructed on body temperature tap water enema.  Repeat prn.

## 2019-12-12 ENCOUNTER — Other Ambulatory Visit: Payer: Self-pay

## 2019-12-13 ENCOUNTER — Other Ambulatory Visit: Payer: Self-pay | Admitting: Psychiatry

## 2019-12-13 ENCOUNTER — Other Ambulatory Visit: Payer: Self-pay | Admitting: Physician Assistant

## 2019-12-13 ENCOUNTER — Ambulatory Visit
Admission: RE | Admit: 2019-12-13 | Discharge: 2019-12-13 | Disposition: A | Discharge status: Home / Self Care | Payer: Managed Care, Other (non HMO) | Source: Ambulatory Visit | Attending: Physician Assistant | Admitting: Physician Assistant

## 2019-12-13 ENCOUNTER — Other Ambulatory Visit: Payer: Self-pay | Admitting: Family

## 2019-12-13 DIAGNOSIS — K5900 Constipation, unspecified: Secondary | ICD-10-CM

## 2019-12-13 DIAGNOSIS — R109 Unspecified abdominal pain: Secondary | ICD-10-CM

## 2019-12-13 DIAGNOSIS — R110 Nausea: Secondary | ICD-10-CM

## 2019-12-14 ENCOUNTER — Other Ambulatory Visit: Payer: Self-pay | Admitting: Family Medicine

## 2019-12-14 DIAGNOSIS — F4321 Adjustment disorder with depressed mood: Secondary | ICD-10-CM

## 2019-12-16 MED ORDER — LORAZEPAM 0.5 MG PO TABS: 0.5000 mg | ORAL_TABLET | Freq: Every evening | ORAL | 0 refills | Status: DC | PRN

## 2019-12-18 ENCOUNTER — Other Ambulatory Visit: Payer: Self-pay

## 2021-02-13 ENCOUNTER — Other Ambulatory Visit: Payer: Self-pay

## 2021-02-13 ENCOUNTER — Ambulatory Visit (INDEPENDENT_AMBULATORY_CARE_PROVIDER_SITE_OTHER): Payer: Managed Care, Other (non HMO) | Admitting: Family Medicine

## 2021-02-13 ENCOUNTER — Encounter: Payer: Self-pay | Admitting: Family Medicine

## 2021-02-13 DIAGNOSIS — F4321 Adjustment disorder with depressed mood: Secondary | ICD-10-CM

## 2021-02-13 DIAGNOSIS — F4323 Adjustment disorder with mixed anxiety and depressed mood: Secondary | ICD-10-CM

## 2021-02-13 DIAGNOSIS — L659 Nonscarring hair loss, unspecified: Secondary | ICD-10-CM

## 2021-02-13 DIAGNOSIS — Z1159 Encounter for screening for other viral diseases: Secondary | ICD-10-CM

## 2021-02-13 DIAGNOSIS — Z0000 Encounter for general adult medical examination without abnormal findings: Secondary | ICD-10-CM

## 2021-02-13 DIAGNOSIS — Z13220 Encounter for screening for lipoid disorders: Secondary | ICD-10-CM

## 2021-02-13 DIAGNOSIS — E282 Polycystic ovarian syndrome: Secondary | ICD-10-CM | POA: Diagnosis not present

## 2021-02-13 DIAGNOSIS — Z833 Family history of diabetes mellitus: Secondary | ICD-10-CM | POA: Diagnosis not present

## 2021-02-13 DIAGNOSIS — Z90710 Acquired absence of both cervix and uterus: Secondary | ICD-10-CM

## 2021-02-13 DIAGNOSIS — Z2821 Immunization not carried out because of patient refusal: Secondary | ICD-10-CM

## 2021-02-13 NOTE — Patient Instructions (Signed)
I will call tomorrow with test results.  We'll make plans when I have those results. Keep using the eucerin on the legs.  You could take a benadryl before you go to bed. I strongly recommend the COVID vaccine.

## 2021-02-14 ENCOUNTER — Telehealth: Payer: Self-pay | Admitting: Family Medicine

## 2021-02-14 ENCOUNTER — Encounter: Payer: Self-pay | Admitting: Family Medicine

## 2021-02-14 DIAGNOSIS — Z2821 Immunization not carried out because of patient refusal: Secondary | ICD-10-CM | POA: Insufficient documentation

## 2021-02-14 LAB — POCT GLYCOSYLATED HEMOGLOBIN (HGB A1C): Hemoglobin A1C: 4.8 % (ref 4.0–5.6)

## 2021-02-14 LAB — CMP14+EGFR
ALT: 16 IU/L (ref 0–32)
Calcium: 9 mg/dL (ref 8.7–10.2)

## 2021-02-14 LAB — LIPID PANEL: Cholesterol, Total: 172 mg/dL (ref 100–199)

## 2021-02-14 MED ORDER — OZEMPIC (0.25 OR 0.5 MG/DOSE) 2 MG/1.5ML ~~LOC~~ SOPN: 0.5000 mg | PEN_INJECTOR | SUBCUTANEOUS | 0 refills | Status: DC

## 2021-02-14 MED ORDER — TRULICITY 0.75 MG/0.5ML ~~LOC~~ SOAJ: 0.7500 mg | SUBCUTANEOUS | 0 refills | Status: DC

## 2021-02-14 NOTE — Assessment & Plan Note (Signed)
Death of grandmother still adds to her anxiety.

## 2021-02-14 NOTE — Assessment & Plan Note (Signed)
Frustrating for her since good diet and exercise.

## 2021-02-14 NOTE — Assessment & Plan Note (Signed)
I recommended.  She declined.

## 2021-02-14 NOTE — Assessment & Plan Note (Signed)
Hair loss.  Another reason to check TSH.  Likely another marker that stress is high.

## 2021-02-14 NOTE — Addendum Note (Signed)
Addended by: Jennette Bill on: 02/14/2021 11:57 AM   Modules accepted: Orders

## 2021-02-14 NOTE — Assessment & Plan Note (Signed)
Screen x 1.  Low risk 

## 2021-02-14 NOTE — Assessment & Plan Note (Signed)
A1C normal.  Will start ozempic for wt loss per patient request.

## 2021-02-14 NOTE — Progress Notes (Signed)
    SUBJECTIVE:   CHIEF COMPLAINT / HPI:   Annual physical and more: Chronic problems: 1. PCOS.  S/P hysterectomy.  Having some hirsuitism.  Has been tried multiple times on metformin.  Cannot tolerate 2. Obesity.  She has been working very hard on diet and exercise.  Frustrated about no weight loss.  Asks about could thyroid problems cause this. 3. Strong family hx of DM.  Wants screened.  Does have polyuria.   4. She does have anxiety and rare panic attacks.  Not currently on any meds.  Not eager to start any meds - at least until after the labs are back. HPDP: Does not need paps, S/P hystecectomy.   Strong FHx of breast cancer.  Has had one mammo.  Will start annual screening at age 61 No prior COVID vaccinations.  I recommended.  She declined.    PERTINENT  PMH / PSH: CP, SOB, Abd pain, bleeding, edema.  No change in bowel, bladder or appetite.    OBJECTIVE:   BP 116/82   Pulse 67   Ht 5\' 7"  (1.702 m)   Wt 251 lb 6.4 oz (114 kg)   SpO2 96%   BMI 39.37 kg/m   HEENT WNL Neck supple, without mass Lungs clear Cardiac RRR without m or g Abd benign.   Ext no edema Neuro, motor, sensory, gait, cognition and affect all grossly normal.  ASSESSMENT/PLAN:   Encounter for preventive care Generally healthy female with no at risk behaviors.  Grief Death of grandmother still adds to her anxiety.  Adjustment reaction with anxiety and depression Smoldering problem.  Await labs before changing meds  Need for hepatitis C screening test Screen x 1.  Low risk.  Morbid obesity (HCC) Frustrating for her since good diet and exercise.  S/P hysterectomy No paps  PCOS (polycystic ovarian syndrome) Recheck testosterone.  Cannot tolerate metformen.  Hair loss Hair loss.  Another reason to check TSH.  Likely another marker that stress is high.  Screening cholesterol level Check lipids.     , MD [facility]

## 2021-02-14 NOTE — Assessment & Plan Note (Signed)
Recheck testosterone.  Cannot tolerate metformen.

## 2021-02-14 NOTE — Assessment & Plan Note (Signed)
No paps

## 2021-02-14 NOTE — Assessment & Plan Note (Signed)
Generally healthy female with no at risk behaviors.

## 2021-02-14 NOTE — Telephone Encounter (Signed)
Received phone call from pharmacist regarding Trulicity rx. Per pharmacist, when Trulicity was ran through, it is not being covered either. Preferred medication is Bydureon.   Please advise.   Veronda Prude, RN

## 2021-02-14 NOTE — Assessment & Plan Note (Signed)
Smoldering problem.  Await labs before changing meds

## 2021-02-14 NOTE — Telephone Encounter (Signed)
Patient called back and said that she never answered your question. She stated she was asked if she wanted medication called in now or wait for results, she said she would like them be go ahead and be called in. She also stated that if she needed to come in for bloodwork she can do it Tuesday if needed. Please advise. Thanks!

## 2021-02-14 NOTE — Assessment & Plan Note (Signed)
Check lipids 

## 2021-02-15 LAB — CMP14+EGFR
AST: 14 IU/L (ref 0–40)
Albumin/Globulin Ratio: 1.9 (ref 1.2–2.2)
Albumin: 4.5 g/dL (ref 3.8–4.8)
Alkaline Phosphatase: 66 IU/L (ref 44–121)
BUN/Creatinine Ratio: 14 (ref 9–23)
BUN: 10 mg/dL (ref 6–20)
Bilirubin Total: 0.3 mg/dL (ref 0.0–1.2)
CO2: 20 mmol/L (ref 20–29)
Chloride: 104 mmol/L (ref 96–106)
Creatinine, Ser: 0.7 mg/dL (ref 0.57–1.00)
Globulin, Total: 2.4 g/dL (ref 1.5–4.5)
Glucose: 76 mg/dL (ref 65–99)
Potassium: 4.9 mmol/L (ref 3.5–5.2)
Sodium: 140 mmol/L (ref 134–144)
Total Protein: 6.9 g/dL (ref 6.0–8.5)
eGFR: 113 mL/min/{1.73_m2} (ref >59)

## 2021-02-15 LAB — TESTOSTERONE, FREE, TOTAL, SHBG
Sex Hormone Binding: 37.8 nmol/L (ref 24.6–122.0)
Testosterone, Free: 1.4 pg/mL (ref 0.0–4.2)
Testosterone: 13 ng/dL (ref 8–60)

## 2021-02-15 LAB — TSH: TSH: 3.53 u[IU]/mL (ref 0.450–4.500)

## 2021-02-15 LAB — HEPATITIS C ANTIBODY: Hep C Virus Ab: <0.1 s/co ratio (ref 0.0–0.9)

## 2021-02-15 LAB — LIPID PANEL
Chol/HDL Ratio: 3.7 ratio (ref 0.0–4.4)
HDL: 47 mg/dL (ref >39)
LDL Chol Calc (NIH): 97 mg/dL (ref 0–99)
Triglycerides: 161 mg/dL — ABNORMAL HIGH (ref 0–149)
VLDL Cholesterol Cal: 28 mg/dL (ref 5–40)

## 2021-02-19 ENCOUNTER — Telehealth: Payer: Self-pay

## 2021-02-19 NOTE — Telephone Encounter (Signed)
Received fax from pharmacy, PA needed on Trulicity. Clinical questions submitted via Cover My Meds. Waiting on response, could take up to 72 hours.  Cover My Meds info: Key: [id number]

## 2021-03-05 NOTE — Telephone Encounter (Signed)
Medication was not approved due not being FDA approved. Looks like PCP has sent in Ozempic as an alternative.

## 2021-03-12 ENCOUNTER — Other Ambulatory Visit: Payer: Self-pay | Admitting: Family Medicine

## 2021-03-12 ENCOUNTER — Encounter: Payer: Self-pay | Admitting: Family Medicine

## 2021-03-13 ENCOUNTER — Ambulatory Visit: Payer: Self-pay | Admitting: Family Medicine

## 2021-03-19 MED ORDER — SEMAGLUTIDE-WEIGHT MANAGEMENT 0.25 MG/0.5ML ~~LOC~~ SOAJ: 0.2500 mg | SUBCUTANEOUS | Status: DC

## 2021-05-23 ENCOUNTER — Other Ambulatory Visit: Payer: Self-pay | Admitting: Family Medicine

## 2021-05-23 DIAGNOSIS — Z1231 Encounter for screening mammogram for malignant neoplasm of breast: Secondary | ICD-10-CM

## 2021-06-03 ENCOUNTER — Other Ambulatory Visit: Payer: Self-pay

## 2021-06-03 ENCOUNTER — Ambulatory Visit
Admission: RE | Admit: 2021-06-03 | Discharge: 2021-06-03 | Disposition: A | Discharge status: Home / Self Care | Payer: 59 | Source: Ambulatory Visit | Attending: Family Medicine | Admitting: Family Medicine

## 2021-06-03 DIAGNOSIS — Z1231 Encounter for screening mammogram for malignant neoplasm of breast: Secondary | ICD-10-CM

## 2021-06-06 ENCOUNTER — Other Ambulatory Visit: Payer: Self-pay | Admitting: Family Medicine

## 2021-06-06 DIAGNOSIS — R928 Other abnormal and inconclusive findings on diagnostic imaging of breast: Secondary | ICD-10-CM

## 2021-06-20 ENCOUNTER — Other Ambulatory Visit: Payer: Self-pay

## 2021-06-20 ENCOUNTER — Other Ambulatory Visit: Payer: Self-pay | Admitting: Family Medicine

## 2021-06-20 ENCOUNTER — Ambulatory Visit
Admission: RE | Admit: 2021-06-20 | Discharge: 2021-06-20 | Disposition: A | Discharge status: Home / Self Care | Payer: Managed Care, Other (non HMO) | Source: Ambulatory Visit | Attending: Family Medicine | Admitting: Family Medicine

## 2021-06-20 ENCOUNTER — Ambulatory Visit: Payer: Managed Care, Other (non HMO)

## 2021-06-20 DIAGNOSIS — N6320 Unspecified lump in the left breast, unspecified quadrant: Secondary | ICD-10-CM

## 2021-06-20 DIAGNOSIS — R928 Other abnormal and inconclusive findings on diagnostic imaging of breast: Secondary | ICD-10-CM

## 2021-06-24 ENCOUNTER — Other Ambulatory Visit: Payer: Managed Care, Other (non HMO)

## 2021-06-27 ENCOUNTER — Ambulatory Visit
Admission: RE | Admit: 2021-06-27 | Discharge: 2021-06-27 | Disposition: A | Discharge status: Home / Self Care | Payer: Managed Care, Other (non HMO) | Source: Ambulatory Visit | Attending: Family Medicine | Admitting: Family Medicine

## 2021-06-27 ENCOUNTER — Other Ambulatory Visit: Payer: Self-pay

## 2021-06-27 DIAGNOSIS — N6320 Unspecified lump in the left breast, unspecified quadrant: Secondary | ICD-10-CM

## 2021-08-28 ENCOUNTER — Encounter: Payer: Self-pay | Admitting: Family Medicine

## 2021-11-25 ENCOUNTER — Encounter: Payer: Self-pay | Admitting: Family Medicine

## 2021-11-25 ENCOUNTER — Other Ambulatory Visit: Payer: Self-pay

## 2021-11-25 ENCOUNTER — Ambulatory Visit (INDEPENDENT_AMBULATORY_CARE_PROVIDER_SITE_OTHER): Payer: Managed Care, Other (non HMO) | Admitting: Family Medicine

## 2021-11-25 VITALS — BP 134/78 | HR 89 | Ht 67.0 in | Wt 260.0 lb

## 2021-11-25 DIAGNOSIS — M79605 Pain in left leg: Secondary | ICD-10-CM

## 2021-11-25 DIAGNOSIS — Z833 Family history of diabetes mellitus: Secondary | ICD-10-CM | POA: Diagnosis not present

## 2021-11-25 LAB — POCT GLYCOSYLATED HEMOGLOBIN (HGB A1C): Hemoglobin A1C: 5.2 % (ref 4.0–5.6)

## 2021-11-25 NOTE — Progress Notes (Signed)
° ° °  SUBJECTIVE:   CHIEF COMPLAINT / HPI:   Whitney Smith is a 41 yo F who presents for the below.   Left lower extremity pain Experiencing left lower extremity pain that started today.  She has tenderness bilaterally ongoing for a month to a month and a half.  She has also noticed that her legs are swollen.  She states when she bends in her left leg past 90 degrees it feels as if she is cutting off her circulation.  She denies any chest pain, shortness of breath.  She endorses a family history of blood clots.  She denies any personal history of blood clots.  Concern for diabetes, family history Experiencing headaches after eating and feels as if her vision is going blurry intermittently.  She states that she is mostly worried that she now has diabetes and would like to have this checked out.  PERTINENT  PMH / PSH: Adjustment reaction with anxiety and depression  OBJECTIVE:   BP 134/78    Pulse 89    Ht 5\' 7"  (1.702 m)    Wt 260 lb (117.9 kg)    SpO2 100%    BMI 40.72 kg/m   Vision Screening   Right eye Left eye Both eyes  Without correction 20/20 20/20 20/20   With correction      Results for orders placed or performed in visit on 11/25/21 (from the past 24 hour(s))  HgB A1c     Status: None   Collection Time: 11/25/21  3:32 PM  Result Value Ref Range   Hemoglobin A1C 5.2 4.0 - 5.6 %   HbA1c POC (<> result, manual entry)     HbA1c, POC (prediabetic range)     HbA1c, POC (controlled diabetic range)      Physical Exam Vitals reviewed.  Constitutional:      General: She is not in acute distress.    Appearance: She is not ill-appearing.  Cardiovascular:     Rate and Rhythm: Normal rate and regular rhythm.  Pulmonary:     Effort: Pulmonary effort is normal. No respiratory distress.     Breath sounds: Normal breath sounds. No wheezing, rhonchi or rales.  Musculoskeletal:     Right lower leg: Swelling present. No tenderness. Edema present.     Left lower leg: Swelling and  tenderness present. No bony tenderness. Edema present.  Neurological:     Mental Status: She is alert.   ASSESSMENT/PLAN:   1. Acute pain of left lower extremity 1 day of pain with a history of chronic bilateral leg tenderness over 1 month.  Vital signs are stable.  Exam remarkable for tender to palpation left lower leg with edema bilaterally.  Suspicion for DVT is lower but with family history will need to obtain imaging to rule out.  More likely lymphedema with tenderness due to increased swelling.  If DVT ultrasound negative consider compression stockings and weight loss.  ED precautions given.  Duplex ordered and scheduled. - VAS 11/27/21 LOWER EXTREMITY VENOUS (DVT); Future  2. Family history of diabetes mellitus A1c 5.2 normal range.  Normal vision here today.  Consider further work-up of symptoms headache after eating and intermittent vision changes at follow-up if persistent.  ED precautions given.  11/27/21, DO [facility]

## 2021-11-25 NOTE — Patient Instructions (Signed)
Thank you for coming in today.  Today we discussed left lower leg pain and swelling.  I would like for you to go to [facility] to get an ultrasound at your scheduled time to rule out a a blood clot in the leg.  We will plan follow-up after imaging available.  If normal I will communicate via MyChart.  If abnormal I will give you a call.  Today your A1c was 5.2 which is normal non-diabetic range.  Dr. Janus Molder

## 2021-11-27 ENCOUNTER — Other Ambulatory Visit: Payer: Self-pay

## 2021-11-27 ENCOUNTER — Ambulatory Visit (HOSPITAL_COMMUNITY)
Admission: RE | Admit: 2021-11-27 | Discharge: 2021-11-27 | Disposition: A | Discharge status: Home / Self Care | Payer: Managed Care, Other (non HMO) | Source: Ambulatory Visit | Attending: Family Medicine | Admitting: Family Medicine

## 2021-11-27 DIAGNOSIS — M79605 Pain in left leg: Secondary | ICD-10-CM | POA: Insufficient documentation

## 2021-11-27 NOTE — Progress Notes (Signed)
VASCULAR LAB    Bilateral lower extremity venous duplex has been performed.  See CV proc for preliminary results.   Laurelyn Terrero, RVT 11/27/2021, 8:56 AM

## 2021-11-28 ENCOUNTER — Telehealth: Payer: Self-pay | Admitting: Family Medicine

## 2021-11-28 NOTE — Telephone Encounter (Signed)
Called to discuss negative DVT ultrasound results.  No answer left voicemail and callback number if she has questions.  Otherwise suggested compression stockings.  Lavonda Jumbo, DO 11/28/2021, 4:10 PM PGY-3, [facility]

## 2021-12-03 ENCOUNTER — Other Ambulatory Visit: Payer: Self-pay | Admitting: Family Medicine

## 2021-12-03 DIAGNOSIS — N644 Mastodynia: Secondary | ICD-10-CM | POA: Insufficient documentation

## 2021-12-05 ENCOUNTER — Other Ambulatory Visit: Payer: Self-pay | Admitting: Family Medicine

## 2021-12-05 DIAGNOSIS — N644 Mastodynia: Secondary | ICD-10-CM

## 2021-12-22 ENCOUNTER — Ambulatory Visit
Admission: RE | Admit: 2021-12-22 | Discharge: 2021-12-22 | Disposition: A | Discharge status: Home / Self Care | Payer: Managed Care, Other (non HMO) | Source: Ambulatory Visit | Attending: Family Medicine | Admitting: Family Medicine

## 2021-12-22 DIAGNOSIS — N644 Mastodynia: Secondary | ICD-10-CM

## 2022-01-01 ENCOUNTER — Other Ambulatory Visit: Payer: Managed Care, Other (non HMO)

## 2022-01-26 ENCOUNTER — Encounter: Payer: Self-pay | Admitting: Family Medicine

## 2022-01-26 NOTE — Telephone Encounter (Signed)
Checked both boxes and didn't see form as of today.  There is a telephone in Whitney Smith's chart regarding this form.  Lennyn Bellanca,CMA ?

## 2022-03-31 ENCOUNTER — Encounter: Payer: Self-pay | Admitting: *Deleted

## 2022-05-03 ENCOUNTER — Encounter: Payer: Self-pay | Admitting: Family Medicine

## 2022-11-05 ENCOUNTER — Encounter: Payer: Self-pay | Admitting: Family Medicine

## 2022-11-05 ENCOUNTER — Ambulatory Visit (INDEPENDENT_AMBULATORY_CARE_PROVIDER_SITE_OTHER): Payer: Managed Care, Other (non HMO) | Admitting: Family Medicine

## 2022-11-05 VITALS — BP 128/84 | HR 90 | Ht 67.0 in | Wt 262.4 lb

## 2022-11-05 DIAGNOSIS — L309 Dermatitis, unspecified: Secondary | ICD-10-CM | POA: Diagnosis not present

## 2022-11-05 DIAGNOSIS — F4321 Adjustment disorder with depressed mood: Secondary | ICD-10-CM | POA: Diagnosis not present

## 2022-11-05 DIAGNOSIS — Z13220 Encounter for screening for lipoid disorders: Secondary | ICD-10-CM | POA: Diagnosis not present

## 2022-11-05 MED ORDER — LORAZEPAM 0.5 MG PO TABS: 0.5000 mg | ORAL_TABLET | Freq: Two times a day (BID) | ORAL | 2 refills | Status: AC | PRN

## 2022-11-05 MED ORDER — TRIAMCINOLONE ACETONIDE 0.1 % EX OINT: 1.0000 | TOPICAL_OINTMENT | Freq: Two times a day (BID) | CUTANEOUS | 3 refills | Status: DC

## 2022-11-05 MED ORDER — MOUNJARO 2.5 MG/0.5ML ~~LOC~~ SOAJ: 2.5000 mg | SUBCUTANEOUS | 1 refills | Status: DC

## 2022-11-05 NOTE — Patient Instructions (Addendum)
If you stay here, Dr. Ky Barban will be your doctor.  She is good and will be here for the long run.   If you decide to not stay here, I would encourage you to stay with a [place] practice. The [facility] Nurse Practitioner has good reviews.  I believe one of our graduates, Dr. Vinnie Langton, will be joining that practice.  He is also very good.   My last day is 2/15.  Hopefully, we can get a wt loss med approved and I can bump you up to the next dose of the med.  We shall see.   I will call with blood test results.   It has been a real pleasure.

## 2022-11-05 NOTE — Assessment & Plan Note (Addendum)
Failed diet and exercise.  Now with BMI= 31.  Cannot use metformin.  Try to get [facility] approved. Nicely, screen for DM is normal.

## 2022-11-05 NOTE — Assessment & Plan Note (Signed)
Eczema, not shingles.  Medium/low potency steroid.

## 2022-11-05 NOTE — Progress Notes (Signed)
     SUBJECTIVE:   CHIEF COMPLAINT / HPI:   A host of complaints, many of which may be related. A very emotionally tough past 2 years.  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   Rash multiple places: neck, back, arm.  Seen somewhere and told shingles.  She doubts.  Very pruritic.  No fever or systemic symptoms. Not sleeping.  Stress and weight gain playing a role.  With weight, it is hard to get comfortable at night. Gained some weight.  Knows she has been stress eating.  Still, she has been quit diligent about diet and exercise without results.  We could not get semaglutide approved last year asks about wt loss surgery. Tingling feet bilaterally.  No frank numbness.  Some back pain but does not have anything that sounds like leg radiculopathy. Has PCOS and is status post hysterectomy.  Unfortunately, she is intolerant to even low dose metformin due to diarrhea.   OBJECTIVE:   BP 128/84   Pulse 90   Ht 5\' 7"  (1.702 m)   Wt 262 lb 6.4 oz (119 kg)   SpO2 98%   BMI 41.10 kg/m   Lungs clear Cardiac RRR without m or g Skin multiple patches of numular eczema.  Nothing that remotely looks like shingles Feet normal   ASSESSMENT/PLAN:   No problem-specific Assessment & Plan notes found for this encounter.     Zenia Resides, MD [place]

## 2022-11-05 NOTE — Assessment & Plan Note (Signed)
Patient is resistent to meds.  She still has a few tabs of lorazepam from a 2018 Rx Disp 30.  After a lot of discussion, we jointly decided on lorazepam as a short term med only.

## 2022-11-05 NOTE — Assessment & Plan Note (Signed)
Check again?

## 2022-11-06 LAB — CMP14+EGFR
ALT: 20 IU/L (ref 0–32)
AST: 12 IU/L (ref 0–40)
Albumin/Globulin Ratio: 1.7 (ref 1.2–2.2)
Albumin: 4.6 g/dL (ref 3.9–4.9)
Alkaline Phosphatase: 74 IU/L (ref 44–121)
BUN/Creatinine Ratio: 19 (ref 9–23)
BUN: 13 mg/dL (ref 6–24)
Bilirubin Total: 0.3 mg/dL (ref 0.0–1.2)
CO2: 23 mmol/L (ref 20–29)
Calcium: 9.4 mg/dL (ref 8.7–10.2)
Chloride: 103 mmol/L (ref 96–106)
Creatinine, Ser: 0.68 mg/dL (ref 0.57–1.00)
Globulin, Total: 2.7 g/dL (ref 1.5–4.5)
Glucose: 81 mg/dL (ref 70–99)
Potassium: 4.3 mmol/L (ref 3.5–5.2)
Sodium: 137 mmol/L (ref 134–144)
Total Protein: 7.3 g/dL (ref 6.0–8.5)
eGFR: 112 mL/min/{1.73_m2} (ref >59)

## 2022-11-06 LAB — TSH: TSH: 2.42 u[IU]/mL (ref 0.450–4.500)

## 2022-11-06 LAB — LIPID PANEL
Chol/HDL Ratio: 4.2 ratio (ref 0.0–4.4)
Cholesterol, Total: 189 mg/dL (ref 100–199)
HDL: 45 mg/dL (ref >39)
LDL Chol Calc (NIH): 119 mg/dL — ABNORMAL HIGH (ref 0–99)
Triglycerides: 143 mg/dL (ref 0–149)
VLDL Cholesterol Cal: 25 mg/dL (ref 5–40)

## 2022-11-06 LAB — HEMOGLOBIN A1C
Est. average glucose Bld gHb Est-mCnc: 103 mg/dL
Hgb A1c MFr Bld: 5.2 % (ref 4.8–5.6)

## 2022-11-12 ENCOUNTER — Telehealth: Payer: Self-pay

## 2022-11-12 NOTE — Telephone Encounter (Signed)
A Prior Authorization was initiated for this patients MOUNJARO through CoverMyMeds.   Key: [id number]

## 2022-11-13 NOTE — Telephone Encounter (Signed)
Prior Auth for patients medication MOUNJARO denied by [facility] via CoverMyMeds.   Reason:   CoverMyMeds Key: [id number]

## 2022-11-13 NOTE — Telephone Encounter (Signed)
Called patient and informed her that med only covered for patients with diabetes, which she does not have.

## 2022-11-16 ENCOUNTER — Telehealth: Payer: Self-pay

## 2022-11-16 NOTE — Telephone Encounter (Signed)
ERROR

## 2022-12-01 ENCOUNTER — Encounter: Payer: Self-pay | Admitting: Family Medicine

## 2022-12-06 ENCOUNTER — Encounter (HOSPITAL_BASED_OUTPATIENT_CLINIC_OR_DEPARTMENT_OTHER): Payer: Self-pay | Admitting: Emergency Medicine

## 2022-12-06 ENCOUNTER — Other Ambulatory Visit: Payer: Self-pay

## 2022-12-06 DIAGNOSIS — R1031 Right lower quadrant pain: Secondary | ICD-10-CM | POA: Diagnosis present

## 2022-12-06 DIAGNOSIS — K5900 Constipation, unspecified: Secondary | ICD-10-CM | POA: Insufficient documentation

## 2022-12-06 LAB — CBC
HCT: 38 % (ref 36.0–46.0)
Hemoglobin: 13.2 g/dL (ref 12.0–15.0)
MCH: 29.9 pg (ref 26.0–34.0)
MCHC: 34.7 g/dL (ref 30.0–36.0)
MCV: 86 fL (ref 80.0–100.0)
Platelets: 266 10*3/uL (ref 150–400)
RBC: 4.42 MIL/uL (ref 3.87–5.11)
RDW: 13 % (ref 11.5–15.5)
WBC: 8.6 10*3/uL (ref 4.0–10.5)
nRBC: 0 % (ref 0.0–0.2)

## 2022-12-06 LAB — URINALYSIS, ROUTINE W REFLEX MICROSCOPIC
Bilirubin Urine: NEGATIVE
Glucose, UA: NEGATIVE mg/dL
Hgb urine dipstick: NEGATIVE
Ketones, ur: NEGATIVE mg/dL
Leukocytes,Ua: NEGATIVE
Nitrite: NEGATIVE
Protein, ur: NEGATIVE mg/dL
Specific Gravity, Urine: 1.02 (ref 1.005–1.030)
pH: 7.5 (ref 5.0–8.0)

## 2022-12-06 NOTE — ED Triage Notes (Signed)
Pt c/o RLQ that started a few hours ago; +nausea

## 2022-12-07 ENCOUNTER — Emergency Department (HOSPITAL_BASED_OUTPATIENT_CLINIC_OR_DEPARTMENT_OTHER)
Admission: EM | Admit: 2022-12-07 | Discharge: 2022-12-07 | Disposition: A | Discharge status: Home / Self Care | Payer: Managed Care, Other (non HMO) | Attending: Emergency Medicine | Admitting: Emergency Medicine

## 2022-12-07 ENCOUNTER — Emergency Department (HOSPITAL_BASED_OUTPATIENT_CLINIC_OR_DEPARTMENT_OTHER): Payer: Managed Care, Other (non HMO)

## 2022-12-07 DIAGNOSIS — K5900 Constipation, unspecified: Secondary | ICD-10-CM

## 2022-12-07 LAB — COMPREHENSIVE METABOLIC PANEL
ALT: 21 U/L (ref 0–44)
AST: 22 U/L (ref 15–41)
Albumin: 4 g/dL (ref 3.5–5.0)
Alkaline Phosphatase: 60 U/L (ref 38–126)
Anion gap: 6 (ref 5–15)
BUN: 14 mg/dL (ref 6–20)
CO2: 24 mmol/L (ref 22–32)
Calcium: 8.7 mg/dL — ABNORMAL LOW (ref 8.9–10.3)
Chloride: 104 mmol/L (ref 98–111)
Creatinine, Ser: 0.73 mg/dL (ref 0.44–1.00)
GFR, Estimated: >60 mL/min (ref >60)
Glucose, Bld: 118 mg/dL — ABNORMAL HIGH (ref 70–99)
Potassium: 4 mmol/L (ref 3.5–5.1)
Sodium: 134 mmol/L — ABNORMAL LOW (ref 135–145)
Total Bilirubin: 0.5 mg/dL (ref 0.3–1.2)
Total Protein: 7.3 g/dL (ref 6.5–8.1)

## 2022-12-07 LAB — LIPASE, BLOOD: Lipase: 26 U/L (ref 11–51)

## 2022-12-07 MED ORDER — SODIUM CHLORIDE 0.9 % IV BOLUS
500.0000 mL | Freq: Once | INTRAVENOUS | Status: AC
  Administered 2022-12-07: 500 mL via INTRAVENOUS

## 2022-12-07 MED ORDER — DICYCLOMINE HCL 10 MG/ML IM SOLN
20.0000 mg | Freq: Once | INTRAMUSCULAR | Status: AC
  Administered 2022-12-07: 20 mg via INTRAMUSCULAR
  Filled 2022-12-07: qty 2

## 2022-12-07 MED ORDER — KETOROLAC TROMETHAMINE 30 MG/ML IJ SOLN
15.0000 mg | Freq: Once | INTRAMUSCULAR | Status: AC
  Administered 2022-12-07: 15 mg via INTRAVENOUS
  Filled 2022-12-07: qty 1

## 2022-12-07 MED ORDER — ONDANSETRON HCL 4 MG/2ML IJ SOLN
4.0000 mg | Freq: Once | INTRAMUSCULAR | Status: AC
  Administered 2022-12-07: 4 mg via INTRAVENOUS
  Filled 2022-12-07: qty 2

## 2022-12-07 NOTE — ED Notes (Signed)
Writer made 2 attempts to place peripheral IV and was unsuccessful on both counts.

## 2022-12-07 NOTE — ED Provider Notes (Signed)
[facility] [place] Provider Note   CSN: [id number] Arrival date & time: 12/06/22  2235     History  Chief Complaint  Patient presents with   Abdominal Pain    Whitney Smith is a 42 y.o. female.  The history is provided by the patient.  Abdominal Pain Pain location:  RLQ Pain quality: sharp   Pain radiates to:  Does not radiate Pain severity:  Severe Onset quality:  Sudden Timing:  Constant Progression:  Waxing and waning Chronicity:  New Context: not suspicious food intake and not trauma   Relieved by:  Nothing Worsened by:  Nothing Ineffective treatments:  None tried Associated symptoms: constipation and nausea   Associated symptoms: no chest pain, no diarrhea, no fever and no vomiting        Home Medications Prior to Admission medications   Medication Sig Start Date End Date Taking? Authorizing Provider  LORazepam (ATIVAN) 0.5 MG tablet Take 1 tablet (0.5 mg total) by mouth 2 (two) times daily as needed for anxiety. 11/05/22   Zenia Resides, MD  tirzepatide [facility]) 2.5 MG/0.5ML Pen Inject 2.5 mg into the skin once a week. 11/05/22   Zenia Resides, MD  triamcinolone ointment (KENALOG) 0.1 % Apply 1 Application topically 2 (two) times daily. 11/05/22   Zenia Resides, MD  valACYclovir (VALTREX) 1000 MG tablet Take 1,000 mg by mouth daily as needed. outbreaks 11/09/16   [provider]      Allergies    Penicillins, Sulfamethoxazole, Codeine phosphate, and Metformin and related    Review of Systems   Review of Systems  Constitutional:  Negative for fever.  HENT:  Negative for facial swelling.   Eyes:  Negative for redness.  Respiratory:  Negative for wheezing and stridor.   Cardiovascular:  Negative for chest pain.  Gastrointestinal:  Positive for abdominal pain, constipation and nausea. Negative for diarrhea and vomiting.  All other systems reviewed and are negative.   Physical Exam Updated Vital  Signs BP 130/80   Pulse (!) 105   Temp 98.5 F (36.9 C) (Oral)   Resp (!) 22   Ht 5' 7"$  (1.702 m)   Wt 113.4 kg   SpO2 98%   BMI 39.16 kg/m  Physical Exam Vitals and nursing note reviewed.  Constitutional:      General: She is not in acute distress.    Appearance: She is well-developed.  HENT:     Head: Normocephalic and atraumatic.     Nose: Nose normal.     Mouth/Throat:     Mouth: Mucous membranes are moist.  Eyes:     Pupils: Pupils are equal, round, and reactive to light.  Cardiovascular:     Rate and Rhythm: Normal rate and regular rhythm.     Pulses: Normal pulses.     Heart sounds: Normal heart sounds.  Pulmonary:     Effort: Pulmonary effort is normal. No respiratory distress.     Breath sounds: Normal breath sounds.  Abdominal:     General: Bowel sounds are normal. There is no distension.     Palpations: Abdomen is soft.     Tenderness: There is no abdominal tenderness. There is no guarding or rebound.  Genitourinary:    Vagina: No vaginal discharge.  Musculoskeletal:        General: Normal range of motion.     Cervical back: Neck supple.  Skin:    General: Skin is warm and dry.  Capillary Refill: Capillary refill takes less than 2 seconds.     Findings: No erythema or rash.  Neurological:     General: No focal deficit present.     Mental Status: She is oriented to person, place, and time.     Deep Tendon Reflexes: Reflexes normal.  Psychiatric:        Mood and Affect: Mood normal.     ED Results / Procedures / Treatments   Labs (all labs ordered are listed, but only abnormal results are displayed) Results for orders placed or performed during the hospital encounter of 12/07/22  Lipase, blood  Result Value Ref Range   Lipase 26 11 - 51 U/L  Comprehensive metabolic panel  Result Value Ref Range   Sodium 134 (L) 135 - 145 mmol/L   Potassium 4.0 3.5 - 5.1 mmol/L   Chloride 104 98 - 111 mmol/L   CO2 24 22 - 32 mmol/L   Glucose, Bld 118 (H) 70  - 99 mg/dL   BUN 14 6 - 20 mg/dL   Creatinine, Ser 0.73 0.44 - 1.00 mg/dL   Calcium 8.7 (L) 8.9 - 10.3 mg/dL   Total Protein 7.3 6.5 - 8.1 g/dL   Albumin 4.0 3.5 - 5.0 g/dL   AST 22 15 - 41 U/L   ALT 21 0 - 44 U/L   Alkaline Phosphatase 60 38 - 126 U/L   Total Bilirubin 0.5 0.3 - 1.2 mg/dL   GFR, Estimated >60 >60 mL/min   Anion gap 6 5 - 15  CBC  Result Value Ref Range   WBC 8.6 4.0 - 10.5 K/uL   RBC 4.42 3.87 - 5.11 MIL/uL   Hemoglobin 13.2 12.0 - 15.0 g/dL   HCT 38.0 36.0 - 46.0 %   MCV 86.0 80.0 - 100.0 fL   MCH 29.9 26.0 - 34.0 pg   MCHC 34.7 30.0 - 36.0 g/dL   RDW 13.0 11.5 - 15.5 %   Platelets 266 150 - 400 K/uL   nRBC 0.0 0.0 - 0.2 %  Urinalysis, Routine w reflex microscopic -Urine, Clean Catch  Result Value Ref Range   Color, Urine YELLOW YELLOW   APPearance CLOUDY (A) CLEAR   Specific Gravity, Urine 1.020 1.005 - 1.030   pH 7.5 5.0 - 8.0   Glucose, UA NEGATIVE NEGATIVE mg/dL   Hgb urine dipstick NEGATIVE NEGATIVE   Bilirubin Urine NEGATIVE NEGATIVE   Ketones, ur NEGATIVE NEGATIVE mg/dL   Protein, ur NEGATIVE NEGATIVE mg/dL   Nitrite NEGATIVE NEGATIVE   Leukocytes,Ua NEGATIVE NEGATIVE   CT Renal Stone Study  Result Date: 12/07/2022 CLINICAL DATA:  Abdominal and flank pain. Stone suspected. Right lower quadrant pain onset a few hours ago nausea. EXAM: CT ABDOMEN AND PELVIS WITHOUT CONTRAST TECHNIQUE: Multidetector CT imaging of the abdomen and pelvis was performed following the standard protocol without IV contrast. RADIATION DOSE REDUCTION: This exam was performed according to the departmental dose-optimization program which includes automated exposure control, adjustment of the mA and/or kV according to patient size and/or use of iterative reconstruction technique. COMPARISON:  CT without contrast 03/03/2018, CT with contrast 07/10/2015. FINDINGS: Lower chest: Increased linear scarring or atelectasis lateral left base. Lung bases are otherwise clear. Mild chronic  elevation right hemidiaphragm. The cardiac size is normal. Hepatobiliary: 18.6 cm in length liver with mild steatosis. No focal abnormality is seen without contrast. The gallbladder and bile ducts are unremarkable. Pancreas: No abnormality is seen without contrast. Spleen: No abnormality is seen without contrast. A small  splenule at the anterior splenic tip is again noted. Adrenals/Urinary Tract: Minimal chronic nodular thickening both adrenal glands, stable. No focal abnormality in the unenhanced renal cortex. A 3 mm nonobstructive caliceal stone is noted in the inferior pole the right kidney. There is no ureteral stone or hydroureteronephrosis or further nephrolithiasis. There is no bladder thickening. Stomach/Bowel: Stomach distended with food substrate. No wall thickening. No dilatation or thickening in the unopacified small bowel is seen. The appendix is normal. There is moderate fecal stasis. Sigmoid diverticula without evidence of diverticulitis. Vascular/Lymphatic: Numerous pelvic phleboliths. Unremarkable unenhanced aorta, portal vein. No adenopathy is seen. Reproductive: She appears to have had a supracervical hysterectomy. The ovaries are normal in size. No adnexal masses seen. Other: No abdominal wall hernia or abnormality. No abdominopelvic ascites. Musculoskeletal: No acute or significant osseous findings. IMPRESSION: 1. Nonobstructive solitary 3 mm stone inferior pole right kidney. No ureteral stone or hydroureteronephrosis. 2. Constipation and diverticulosis. 3. Mild hepatic steatosis. 4. Distended stomach with food substrate. This could be due to a recent meal or impaired gastric emptying. No outlet obstructing mass is seen or duodenal dilatation. Electronically Signed   By: Telford Nab M.D.   On: 12/07/2022 02:11     Radiology CT Renal Stone Study  Result Date: 12/07/2022 CLINICAL DATA:  Abdominal and flank pain. Stone suspected. Right lower quadrant pain onset a few hours ago nausea.  EXAM: CT ABDOMEN AND PELVIS WITHOUT CONTRAST TECHNIQUE: Multidetector CT imaging of the abdomen and pelvis was performed following the standard protocol without IV contrast. RADIATION DOSE REDUCTION: This exam was performed according to the departmental dose-optimization program which includes automated exposure control, adjustment of the mA and/or kV according to patient size and/or use of iterative reconstruction technique. COMPARISON:  CT without contrast 03/03/2018, CT with contrast 07/10/2015. FINDINGS: Lower chest: Increased linear scarring or atelectasis lateral left base. Lung bases are otherwise clear. Mild chronic elevation right hemidiaphragm. The cardiac size is normal. Hepatobiliary: 18.6 cm in length liver with mild steatosis. No focal abnormality is seen without contrast. The gallbladder and bile ducts are unremarkable. Pancreas: No abnormality is seen without contrast. Spleen: No abnormality is seen without contrast. A small splenule at the anterior splenic tip is again noted. Adrenals/Urinary Tract: Minimal chronic nodular thickening both adrenal glands, stable. No focal abnormality in the unenhanced renal cortex. A 3 mm nonobstructive caliceal stone is noted in the inferior pole the right kidney. There is no ureteral stone or hydroureteronephrosis or further nephrolithiasis. There is no bladder thickening. Stomach/Bowel: Stomach distended with food substrate. No wall thickening. No dilatation or thickening in the unopacified small bowel is seen. The appendix is normal. There is moderate fecal stasis. Sigmoid diverticula without evidence of diverticulitis. Vascular/Lymphatic: Numerous pelvic phleboliths. Unremarkable unenhanced aorta, portal vein. No adenopathy is seen. Reproductive: She appears to have had a supracervical hysterectomy. The ovaries are normal in size. No adnexal masses seen. Other: No abdominal wall hernia or abnormality. No abdominopelvic ascites. Musculoskeletal: No acute or  significant osseous findings. IMPRESSION: 1. Nonobstructive solitary 3 mm stone inferior pole right kidney. No ureteral stone or hydroureteronephrosis. 2. Constipation and diverticulosis. 3. Mild hepatic steatosis. 4. Distended stomach with food substrate. This could be due to a recent meal or impaired gastric emptying. No outlet obstructing mass is seen or duodenal dilatation. Electronically Signed   By: Telford Nab M.D.   On: 12/07/2022 02:11    Procedures Procedures    Medications Ordered in ED Medications  ketorolac (TORADOL) 30 MG/ML injection 15  mg (15 mg Intravenous Given 12/07/22 0131)  ondansetron (ZOFRAN) injection 4 mg (4 mg Intravenous Given 12/07/22 0131)  sodium chloride 0.9 % bolus 500 mL (0 mLs Intravenous Stopped 12/07/22 0254)  dicyclomine (BENTYL) injection 20 mg (20 mg Intramuscular Given 12/07/22 0252)    ED Course/ Medical Decision Making/ A&P                             Medical Decision Making Patient with abdominal pain and nausea that started suddenly this evening at rest   Amount and/or Complexity of Data Reviewed External Data Reviewed: notes.    Details: Previous notes reviewed  Labs: ordered.    Details: All labs reviewed: normal lipase 26, normal LFTs.  Normal urine without UTI, pregnancy is negative.  Normal white count 8.6, normal hemoglobin 13.2, normal platelet count.  Sodium 134, normal potassium 4creatinine normal .73 Radiology: ordered.    Details: Constipation and full stomach on CT by me   Risk Prescription drug management. Risk Details: Patient is well appearing with normal exam and labs and CT scan.  I suspect this is bowel spasm.  I have advised patient to start miralax in the am for constipation.  Close follow up with PMD.  Strict return.      Final Clinical Impression(s) / ED Diagnoses Final diagnoses:  Constipation, unspecified constipation type  Return for intractable cough, coughing up blood, fevers > 100.4 unrelieved by medication,  shortness of breath, intractable vomiting, chest pain, shortness of breath, weakness, numbness, changes in speech, facial asymmetry, abdominal pain, passing out, Inability to tolerate liquids or food, cough, altered mental status or any concerns. No signs of systemic illness or infection. The patient is nontoxic-appearing on exam and vital signs are within normal limits.  I have reviewed the triage vital signs and the nursing notes. Pertinent labs & imaging results that were available during my care of the patient were reviewed by me and considered in my medical decision making (see chart for details). After history, exam, and medical workup I feel the patient has been appropriately medically screened and is safe for discharge home. Pertinent diagnoses were discussed with the patient. Patient was given return precautions.  Rx / DC Orders ED Discharge Orders     None         Jaramiah Bossard, [place], MD 12/07/22 [id number]

## 2023-07-03 ENCOUNTER — Encounter (HOSPITAL_COMMUNITY): Payer: Self-pay

## 2023-07-03 ENCOUNTER — Ambulatory Visit (HOSPITAL_COMMUNITY)
Admission: EM | Admit: 2023-07-03 | Discharge: 2023-07-03 | Disposition: A | Discharge status: Home / Self Care | Payer: Managed Care, Other (non HMO)

## 2023-07-03 DIAGNOSIS — L259 Unspecified contact dermatitis, unspecified cause: Secondary | ICD-10-CM | POA: Diagnosis not present

## 2023-07-03 MED ORDER — CLOBETASOL PROP EMOLLIENT BASE 0.05 % EX CREA: 1.0000 | TOPICAL_CREAM | Freq: Two times a day (BID) | CUTANEOUS | 0 refills | Status: DC

## 2023-07-03 MED ORDER — CLOBETASOL PROPIONATE 0.05 % EX SHAM: 1.0000 "application " | MEDICATED_SHAMPOO | Freq: Two times a day (BID) | CUTANEOUS | 0 refills | Status: DC

## 2023-07-03 NOTE — Discharge Instructions (Addendum)
Please start taking 1 tablet, 20 mg, of the prednisone you already have once daily with breakfast for the next 5 days. If you do not tolerate 20 mg, you can cut in half and take just 10 mg.  Do not take the hydroxyzine/Atarax.  Use the topical steroid cream called in today twice daily until the rash resolves, but do not exceed 14 days as this can cause skin atrophy and hypopigmentation.  Use the steroid shampoo 1-2 times daily for the next week.   If your symptoms persist, or worsen, please return for reevaluation or follow-up with your primary care physician

## 2023-07-03 NOTE — ED Provider Notes (Signed)
[facility]    CSN: [id number] Arrival date & time: 07/03/23  1020      History   Chief Complaint Chief Complaint  Patient presents with   Rash    HPI Whitney Smith is a 42 y.o. female.   Pleasant 42 year old female presents today due to concerns of a rash.  She states it started several days ago and is primarily to her bilateral medial forearms.  It also affects the left side of her occipital region of her scalp and also her right superior glutes.  She states roughly 5 months ago she had a case of what sounds like shingles for which she was given Valtrex, prednisone, and Atarax.  She cannot take the Atarax over the prednisone as she states it made her nauseated.  She reports that that rash did resolve completely, and feels that her current rash is something different.  She does admit to being under increased stress, but feels this is manageable.  She reports the rash itches intensely and is slightly "burning".  Started on her right forearm.  She has not been using anything for this.  She denies any new lotions, shampoos, detergents.  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/26/2016   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   S/P hysterectomy 11/22/2013   Onychomycosis 11/22/2013    Insomnia 11/22/2013   Herpes simplex 11/22/2013   Screening cholesterol level 11/22/2013   Encounter for preventive care 11/22/2013   Morbid obesity (HCC) 12/23/2006    Past Surgical History:  Procedure Laterality Date   ABDOMINAL HYSTERECTOMY     ENDOMETRIAL ABLATION     TUBAL LIGATION      OB History     Gravida  3   Para  2   Term  2   Preterm      AB  1   Living  2      SAB  1   IAB      Ectopic      Multiple      Live Births               Home Medications    Prior to Admission medications   Medication Sig Start Date End Date Taking? Authorizing Provider  Clobetasol Prop Emollient Base 0.05 % emollient cream Apply 1 Application topically 2 (two) times daily. 07/03/23  Yes Consuello Lassalle L, PA  Clobetasol Propionate 0.05 % shampoo Apply 1 application  topically 2 (two) times daily. Apply to scalp 07/03/23  Yes Kimiyah Blick L, PA  LORazepam (ATIVAN) 0.5 MG tablet Take 1 tablet (0.5 mg total) by mouth 2 (two) times daily as needed for anxiety. 11/05/22  Yes Hensel, Santiago Bumpers, MD  tirzepatide [facility]) 2.5 MG/0.5ML Pen Inject  2.5 mg into the skin once a week. 11/05/22  Yes Hensel, Santiago Bumpers, MD  valACYclovir (VALTREX) 1000 MG tablet Take 1,000 mg by mouth daily as needed. outbreaks 11/09/16  Yes [provider]    Family History Family History  Problem Relation Age of Onset   Cancer Mother    Diabetes Father    Breast cancer Maternal Aunt    Breast cancer Maternal Aunt    Breast cancer Maternal Grandmother    Diabetes Maternal Grandmother    Cancer Maternal Grandmother     Social History Social History   Tobacco Use   Smoking status: Never   Smokeless tobacco: Never  Vaping Use   Vaping status: Never Used  Substance Use Topics   Alcohol use: No   Drug use: No     Allergies   Penicillins, Sulfamethoxazole, Codeine phosphate, and Metformin and related   Review of Systems Review of Systems  Skin:  Positive for rash.  As  per HPI   Physical Exam Triage Vital Signs ED Triage Vitals [07/03/23 1148]  Encounter Vitals Group     BP 120/83     Systolic BP Percentile      Diastolic BP Percentile      Pulse Rate 66     Resp 16     Temp 98.1 F (36.7 C)     Temp Source Oral     SpO2 96 %     Weight      Height      Head Circumference      Peak Flow      Pain Score      Pain Loc      Pain Education      Exclude from Growth Chart    No data found.  Updated Vital Signs BP 120/83 (BP Location: Left Arm)   Pulse 66   Temp 98.1 F (36.7 C) (Oral)   Resp 16   SpO2 96%   Visual Acuity Right Eye Distance:   Left Eye Distance:   Bilateral Distance:    Right Eye Near:   Left Eye Near:    Bilateral Near:     Physical Exam Vitals and nursing note reviewed. Exam conducted with a chaperone present.  Constitutional:      General: She is not in acute distress.    Appearance: Normal appearance. She is not ill-appearing, toxic-appearing or diaphoretic.  HENT:     Head: Normocephalic and atraumatic.     Right Ear: Tympanic membrane, ear canal and external ear normal. There is no impacted cerumen.     Left Ear: Tympanic membrane, ear canal and external ear normal. There is no impacted cerumen.     Nose: Nose normal.     Mouth/Throat:     Mouth: Mucous membranes are moist.  Cardiovascular:     Rate and Rhythm: Normal rate.  Pulmonary:     Effort: Pulmonary effort is normal. No respiratory distress.  Skin:    General: Skin is warm.     Coloration: Skin is not ashen, jaundiced or mottled.     Findings: Rash present. No abrasion or petechiae. Rash is papular. Rash is not crusting.          Comments: Plaques distributed in areas shown above, no crusting, scaling, fissures, pustules or blisters noted. Mild erythema to raised areas, no blanching.  Neurological:     Mental Status: She is alert.      UC Treatments / Results  Labs (all labs ordered  are listed, but only abnormal results are  displayed) Labs Reviewed - No data to display  EKG   Radiology No results found.  Procedures Procedures (including critical care time)  Medications Ordered in UC Medications - No data to display  Initial Impression / Assessment and Plan / UC Course  I have reviewed the triage vital signs and the nursing notes.  Pertinent labs & imaging results that were available during my care of the patient were reviewed by me and considered in my medical decision making (see chart for details).     Contact dermatitis -symptoms appear most consistent with a contact dermatitis, however exact cause unknown.  There is no clinical consistencies with shingles at this time.  Reassurance was provided to patient.  She was prescribed 60 mg of prednisone back in July, she could not tolerate this dosage.  She still has the majority of the bottle with her in her purse, I recommended she start trying 20 mg instead.  If she still cannot tolerate this dosage she can cut in half and try 10 mg daily for the next 5 days.  I will also give her a topical clobetasol cream to apply to the affected areas of her buttock and arms, topical clobetasol shampoo to the affected area of scalp.  Patient and instructed not to use greater than 14 days and to return if rash does not resolve.    Final Clinical Impressions(s) / UC Diagnoses   Final diagnoses:  Contact dermatitis, unspecified contact dermatitis type, unspecified trigger     Discharge Instructions      Please start taking 1 tablet, 20 mg, of the prednisone you already have once daily with breakfast for the next 5 days. If you do not tolerate 20 mg, you can cut in half and take just 10 mg.  Do not take the hydroxyzine/Atarax.  Use the topical steroid cream called in today twice daily until the rash resolves, but do not exceed 14 days as this can cause skin atrophy and hypopigmentation.  Use the steroid shampoo 1-2 times daily for the next week.   If your  symptoms persist, or worsen, please return for reevaluation or follow-up with your primary care physician     ED Prescriptions     Medication Sig Dispense Auth. Provider   Clobetasol Propionate 0.05 % shampoo Apply 1 application  topically 2 (two) times daily. Apply to scalp 118 mL Naziyah Tieszen L, PA   Clobetasol Prop Emollient Base 0.05 % emollient cream Apply 1 Application topically 2 (two) times daily. 30 g Konor Noren L, [place]      PDMP not reviewed this encounter.   Maretta Bees, [place] 07/03/23 1322

## 2023-07-03 NOTE — ED Triage Notes (Signed)
Pt is here for rash all over her body. She reports a history of shingles. She is taking Valtrex.

## 2023-09-02 ENCOUNTER — Ambulatory Visit (INDEPENDENT_AMBULATORY_CARE_PROVIDER_SITE_OTHER): Payer: Managed Care, Other (non HMO) | Admitting: Family Medicine

## 2023-09-02 ENCOUNTER — Encounter: Payer: Self-pay | Admitting: Family Medicine

## 2023-09-02 VITALS — BP 121/84 | HR 85 | Ht 67.0 in | Wt 252.4 lb

## 2023-09-02 DIAGNOSIS — E66812 Obesity, class 2: Secondary | ICD-10-CM | POA: Diagnosis not present

## 2023-09-02 DIAGNOSIS — L309 Dermatitis, unspecified: Secondary | ICD-10-CM

## 2023-09-02 DIAGNOSIS — F4323 Adjustment disorder with mixed anxiety and depressed mood: Secondary | ICD-10-CM

## 2023-09-02 DIAGNOSIS — B009 Herpesviral infection, unspecified: Secondary | ICD-10-CM

## 2023-09-02 MED ORDER — VALACYCLOVIR HCL 1 G PO TABS: 1000.0000 mg | ORAL_TABLET | Freq: Every day | ORAL | 0 refills | Status: AC | PRN

## 2023-09-02 MED ORDER — BUPROPION HCL ER (XL) 150 MG PO TB24: 150.0000 mg | ORAL_TABLET | Freq: Every day | ORAL | 1 refills | Status: AC

## 2023-09-02 MED ORDER — FLUOCINOLONE ACETONIDE 0.01 % OT OIL: 1.0000 [drp] | TOPICAL_OIL | Freq: Two times a day (BID) | OTIC | 1 refills | Status: AC

## 2023-09-02 NOTE — Progress Notes (Signed)
New Patient Office Visit  Subjective    Patient ID: Whitney Smith, female    DOB: 03-Mar-1981  Age: 42 y.o. MRN: [id number]  CC:  Chief Complaint  Patient presents with   New Patient (Initial Visit)    HPI Whitney Smith presents to establish care  Patient was previously a patient of [facility] Leveda [facility] for [facility] years until he retired.  Patient has concerns of rash going on since March of this year.  Mainly on the posterior forearms but also on the posterior scalp, in the right ear canal and also lower back.  Has seen a dermatologist for this.  Has been diagnosed as both shingles and dermatitis.  When she used triamcinolone she felt like it made the rash worse.  Was also prescribed oral steroids at different periods felt like this made her gain weight.  Patient also wishes to discuss her recent weight gain.  She states Dr. Leveda Anna at the previously mentioned a daily depression medicine that also can help with weight loss.  Advised patient I assumed he was referring to bupropion.  We discussed bupropion.  Patient okay with starting this daily.  PMH: Adjustment disorder, grief, eczema, obesity, migraine, PCOS,   PSH: Hysterectomy, endometrial ablation.  2 L knee scopes.    FH: mgm - breast ca, dm; mother - uterine and ovarian ca; father - dm, cad.    Tobacco use: no Alcohol use: no Drug use: no Marital status: divorce.  2 children 16, 23.   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     Outpatient Encounter Medications as of 09/02/2023  Medication Sig   buPROPion (WELLBUTRIN XL) 150 MG 24 hr tablet Take 1 tablet (150 mg total) by mouth daily.   Fluocinolone Acetonide (DERMOTIC) 0.01 % OIL Place 1 drop in ear(s) 2 (two) times daily for 14 days.   LORazepam (ATIVAN) 0.5 MG tablet Take 1 tablet (0.5 mg total) by mouth 2 (two) times daily as needed for anxiety.   tirzepatide [facility]) 2.5 MG/0.5ML Pen Inject 2.5 mg into the skin once a week.   valACYclovir  (VALTREX) 1000 MG tablet Take 1 tablet (1,000 mg total) by mouth daily as needed. outbreaks   [DISCONTINUED] Clobetasol Prop Emollient Base 0.05 % emollient cream Apply 1 Application topically 2 (two) times daily.   [DISCONTINUED] Clobetasol Propionate 0.05 % shampoo Apply 1 application  topically 2 (two) times daily. Apply to scalp   [DISCONTINUED] valACYclovir (VALTREX) 1000 MG tablet Take 1,000 mg by mouth daily as needed. outbreaks   No facility-administered encounter medications on file as of 09/02/2023.    Past Medical History:  Diagnosis Date   Endometriosis    Headache(784.0)    Kidney infection    Migraines    PCOS (polycystic ovarian syndrome)     Past Surgical History:  Procedure Laterality Date   ABDOMINAL HYSTERECTOMY     ENDOMETRIAL ABLATION     TUBAL LIGATION      Family History  Problem Relation Age of Onset   Cancer Mother    Diabetes Father    Breast cancer Maternal Aunt    Breast cancer Maternal Aunt    Breast cancer Maternal Grandmother    Diabetes Maternal Grandmother    Cancer Maternal Grandmother     Social History   Socioeconomic History   Marital status: Married    Spouse name: Not on file   Number of children: Not on file   Years of education: Not on  file   Highest education level: Not on file  Occupational History   Not on file  Tobacco Use   Smoking status: Never   Smokeless tobacco: Never  Vaping Use   Vaping status: Never Used  Substance and Sexual Activity   Alcohol use: No   Drug use: No   Sexual activity: Not on file  Other Topics Concern   Not on file  Social History Narrative   Not on file   Social Determinants of Health   Financial Resource Strain: Not on file  Food Insecurity: Not on file  Transportation Needs: Not on file  Physical Activity: Not on file  Stress: Not on file  Social Connections: Not on file  Intimate Partner Violence: Not on file    ROS      Objective    BP 121/84   Pulse 85   Ht 5\' 7"   (1.702 m)   Wt 252 lb 6.4 oz (114.5 kg)   SpO2 97%   BMI 39.53 kg/m   Physical Exam General: Alert, oriented HEENT: PERRLA, EOMI.  Normal TM.  Mild dry skin in the right ear canal anteriorly. CV: Regular in rhythm Pulmonary: Lungs clear bilaterally GI: Soft, normal bowel sounds MSK: Normal gait Psych: Pleasant affect Extremities: No pedal edema Skin: Papular linear rash on the forearms bilaterally posteriorly.       Assessment & Plan:   Adjustment reaction with anxiety and depression Assessment & Plan: Starting bupropion XL 150 mg.  Follow-up in 1 month  Orders: -     buPROPion HCl ER (XL); Take 1 tablet (150 mg total) by mouth daily.  Dispense: 30 tablet; Refill: 1  Eczema, unspecified type Assessment & Plan: Patient with persistent rash on the posterior forearms, as well as scalp, right ear and low back.  Would still favor allergic dermatitis, but was previously treated with triamcinolone and the rash worsened with this.  Not currently putting anything on it.  Discussed over-the-counter emollients as well as antiitch creams over-the-counter. - Patient agreeable to punch biopsy at next visit to help determine the cause given the poor response to topical steroid - Provided with otic fluocinolone for dry itchy skin in the ear.  Orders: -     Fluocinolone Acetonide; Place 1 drop in ear(s) 2 (two) times daily for 14 days.  Dispense: 20 mL; Refill: 1  Class 2 obesity in adult, unspecified BMI, unspecified obesity type, unspecified whether serious comorbidity present Assessment & Plan: Oral steroids and stress have led to weight gain.  Patient starting bupropion for mood, this may help with weight as well.  At next visit can discuss weight loss counseling more in depth.   Herpes simplex Assessment & Plan: Needs refill of her as needed Valtrex.  Orders: -     valACYclovir HCl; Take 1 tablet (1,000 mg total) by mouth daily as needed. outbreaks  Dispense: 30 tablet; Refill:  0    Return in about 4 weeks (around 09/30/2023) for Mood, biopsy.   Sandre Kitty, MD

## 2023-09-02 NOTE — Patient Instructions (Addendum)
It was nice to see you today,  We addressed the following topics today: -I have started the bupropion XL 150 mg for you to take once daily.  I have included some information regarding this medicine in the after visit summary - I have prescribed a year drop with steroid in it to help with the itching on your right ear.  Use this twice a day for 14 days. - For the generalized itching in your arm you can use any over-the-counter anti-itch cream such as Sarna, calamine lotion.  You can use these multiple times a day. - When I see you again in a month we can biopsy the rash.  Have a great day,  Frederic Jericho, MD

## 2023-09-02 NOTE — Assessment & Plan Note (Signed)
Starting bupropion XL 150 mg.  Follow-up in 1 month

## 2023-09-02 NOTE — Assessment & Plan Note (Signed)
Patient with persistent rash on the posterior forearms, as well as scalp, right ear and low back.  Would still favor allergic dermatitis, but was previously treated with triamcinolone and the rash worsened with this.  Not currently putting anything on it.  Discussed over-the-counter emollients as well as antiitch creams over-the-counter. - Patient agreeable to punch biopsy at next visit to help determine the cause given the poor response to topical steroid - Provided with otic fluocinolone for dry itchy skin in the ear.

## 2023-09-02 NOTE — Assessment & Plan Note (Signed)
Oral steroids and stress have led to weight gain.  Patient starting bupropion for mood, this may help with weight as well.  At next visit can discuss weight loss counseling more in depth.

## 2023-09-02 NOTE — Assessment & Plan Note (Signed)
Needs refill of her as needed Valtrex.

## 2023-09-30 ENCOUNTER — Ambulatory Visit: Payer: Managed Care, Other (non HMO) | Admitting: Family Medicine

## 2023-10-10 NOTE — Progress Notes (Unsigned)
   Established Patient Office Visit  Subjective   Patient ID: Whitney Smith, female    DOB: [birth year]  Age: 42 y.o. MRN: [id number]  No chief complaint on file.   HPI  Mood-bupropion.  Check weight.  Rash-punch biopsy today   The 10-year ASCVD risk score (Arnett DK, et al., 2019) is: 0.8%  Health Maintenance Due  Topic Date Due   COVID-19 Vaccine (1 - 2024-25 season) Never done      Objective:     There were no vitals taken for this visit. {Vitals History (Optional):23777}  Physical Exam   No results found for any visits on 10/11/23.      Assessment & Plan:   There are no diagnoses linked to this encounter.   No follow-ups on file.    Sandre Kitty, MD

## 2023-10-11 ENCOUNTER — Other Ambulatory Visit (HOSPITAL_COMMUNITY)
Admission: RE | Admit: 2023-10-11 | Discharge: 2023-10-11 | Disposition: A | Discharge status: Home / Self Care | Payer: Managed Care, Other (non HMO) | Source: Ambulatory Visit | Attending: Family Medicine | Admitting: Family Medicine

## 2023-10-11 ENCOUNTER — Ambulatory Visit (INDEPENDENT_AMBULATORY_CARE_PROVIDER_SITE_OTHER): Payer: Managed Care, Other (non HMO) | Admitting: Family Medicine

## 2023-10-11 ENCOUNTER — Encounter: Payer: Self-pay | Admitting: Family Medicine

## 2023-10-11 VITALS — BP 138/82 | HR 96 | Temp 98.0°F | Ht 67.0 in | Wt 250.0 lb

## 2023-10-11 DIAGNOSIS — F4323 Adjustment disorder with mixed anxiety and depressed mood: Secondary | ICD-10-CM

## 2023-10-11 DIAGNOSIS — L282 Other prurigo: Secondary | ICD-10-CM

## 2023-10-11 DIAGNOSIS — N951 Menopausal and female climacteric states: Secondary | ICD-10-CM | POA: Diagnosis not present

## 2023-10-11 DIAGNOSIS — J069 Acute upper respiratory infection, unspecified: Secondary | ICD-10-CM

## 2023-10-11 NOTE — Patient Instructions (Addendum)
It was nice to see you today,  We addressed the following topics today: -Keep your Band-Aid on for the rest of the day.  If you have any issues with bleeding or you feel like you would like [place] to look at it let us know and we can schedule a nurse visit. - You also need to schedule nurse visit for later this week or early next week so we can remove the sutures. - He should take about a week for the biopsy results to come in. [facility] the area with soap and water daily.  Do not soak. - For your nasal congestion you can use Afrin over-the-counter and Flonase for your congestion and ear complaints.  Have a great day,  Frederic Jericho, MD

## 2023-10-13 DIAGNOSIS — N951 Menopausal and female climacteric states: Secondary | ICD-10-CM | POA: Insufficient documentation

## 2023-10-13 NOTE — Progress Notes (Signed)
Punch biopsy  Date/Time: 10/11/2023 10:00 AM  Performed by: Sandre Kitty, MD Authorized by: Sandre Kitty, MD    Comments:  Diffuse erythematous macular rash on the posterior right forearm that appears improved from her previous visit.  Area surrounding skin was prepared and draped in usual sterile manner.  Skin was cleaned with alcohol pad.  Rash was numbed with 2% lidocaine with epinephrine.  4 mm punch biopsy then performed.  Sample was removed and placed in sample container.  Single suture was applied to the wound.  Area was cleaned and bandage was applied.  Patient advised to keep bandage on for 24 hours and then clean with soap and water daily.  Advised to return to clinic in approximately 5 days to remove the suture.  Patient advised to inform us if she has any issues with bleeding or infection.  Signs and symptoms of infection discussed.

## 2023-10-13 NOTE — Assessment & Plan Note (Signed)
Symptoms most consistent with viral upper respiratory infection.  Discussed over-the-counter symptomatic remedies.  Follow-up at next visit.

## 2023-10-13 NOTE — Assessment & Plan Note (Signed)
Has not been taking it regularly due to developing 2 viral respiratory infection since last time she was here.  Advised her to take it regularly.  Follow-up again in 1 month.  At that time we can discuss if adjustments need to be made to the dose.

## 2023-10-13 NOTE — Assessment & Plan Note (Signed)
Describes hot flashes and night sweats.  Has not had a menstrual period in many years due to hysterectomy.  Does have her ovaries.  Advised her we can discuss treatment options at her next visit.

## 2023-10-15 ENCOUNTER — Ambulatory Visit: Payer: Managed Care, Other (non HMO)

## 2023-10-19 LAB — SURGICAL PATHOLOGY

## 2023-10-25 ENCOUNTER — Other Ambulatory Visit: Payer: Self-pay | Admitting: Family Medicine

## 2023-10-25 DIAGNOSIS — L2989 Other pruritus: Secondary | ICD-10-CM

## 2023-11-01 ENCOUNTER — Other Ambulatory Visit: Payer: Managed Care, Other (non HMO)

## 2023-11-01 DIAGNOSIS — L2989 Other pruritus: Secondary | ICD-10-CM

## 2023-11-02 ENCOUNTER — Telehealth: Payer: Self-pay

## 2023-11-02 ENCOUNTER — Other Ambulatory Visit: Payer: Self-pay | Admitting: Family Medicine

## 2023-11-02 DIAGNOSIS — Z1231 Encounter for screening mammogram for malignant neoplasm of breast: Secondary | ICD-10-CM

## 2023-11-02 LAB — C3 AND C4
Complement C3, Serum: 177 mg/dL — ABNORMAL HIGH (ref 82–167)
Complement C4, Serum: 32 mg/dL (ref 12–38)

## 2023-11-02 LAB — ANA W/REFLEX IF POSITIVE: Anti Nuclear Antibody (ANA): NEGATIVE

## 2023-11-02 NOTE — Telephone Encounter (Signed)
 Called pt she is advised of the referral

## 2023-11-02 NOTE — Telephone Encounter (Signed)
 It looks like an order was placed today.

## 2023-11-02 NOTE — Telephone Encounter (Signed)
 Copied from CRM [id number]. Topic: General - Other >> Nov 02, 2023 11:23 AM Benton KIDD wrote: Reason for CRM: patient is calling to get the doctor to schedule a mammogram appointment . Patient called mammogram place and they said the doctor would have to schedule this .

## 2023-11-03 ENCOUNTER — Other Ambulatory Visit: Payer: Self-pay | Admitting: Family Medicine

## 2023-11-03 ENCOUNTER — Encounter: Payer: Self-pay | Admitting: Family Medicine

## 2023-11-03 DIAGNOSIS — Z1231 Encounter for screening mammogram for malignant neoplasm of breast: Secondary | ICD-10-CM

## 2023-11-09 ENCOUNTER — Other Ambulatory Visit: Payer: Self-pay | Admitting: Family Medicine

## 2023-11-09 DIAGNOSIS — N644 Mastodynia: Secondary | ICD-10-CM

## 2023-11-18 ENCOUNTER — Ambulatory Visit: Payer: Managed Care, Other (non HMO) | Admitting: Family Medicine

## 2023-11-18 NOTE — Progress Notes (Deleted)
   Established Patient Office Visit  Subjective   Patient ID: Whitney Smith, female    DOB: 06/29/1981  Age: 43 y.o. MRN: [id number]  No chief complaint on file.   HPI  Mammogram-taking care of?  Rash-does she want to try tacrolimus?  Did she follow-up with dermatologist  Bupropion?  Weight  Vasomotor symptoms-   The 10-year ASCVD risk score (Arnett DK, et al., 2019) is: 1%  Health Maintenance Due  Topic Date Due   COVID-19 Vaccine (1 - 2024-25 season) Never done      Objective:     There were no vitals taken for this visit. {Vitals History (Optional):23777}  Physical Exam   No results found for any visits on 11/18/23.      Assessment & Plan:   There are no diagnoses linked to this encounter.   No follow-ups on file.    Sandre Kitty, MD

## 2023-11-24 ENCOUNTER — Ambulatory Visit
Admission: RE | Admit: 2023-11-24 | Discharge: 2023-11-24 | Disposition: A | Discharge status: Home / Self Care | Payer: Managed Care, Other (non HMO) | Source: Ambulatory Visit | Attending: Family Medicine

## 2023-11-24 ENCOUNTER — Other Ambulatory Visit: Payer: Self-pay | Admitting: Family Medicine

## 2023-11-24 DIAGNOSIS — N644 Mastodynia: Secondary | ICD-10-CM

## 2023-11-24 DIAGNOSIS — N6320 Unspecified lump in the left breast, unspecified quadrant: Secondary | ICD-10-CM

## 2023-11-26 ENCOUNTER — Ambulatory Visit
Admission: RE | Admit: 2023-11-26 | Discharge: 2023-11-26 | Disposition: A | Discharge status: Home / Self Care | Payer: Managed Care, Other (non HMO) | Source: Ambulatory Visit | Attending: Family Medicine | Admitting: Family Medicine

## 2023-11-26 DIAGNOSIS — N6320 Unspecified lump in the left breast, unspecified quadrant: Secondary | ICD-10-CM

## 2023-11-26 HISTORY — PX: BREAST BIOPSY: SHX20

## 2023-11-29 LAB — SURGICAL PATHOLOGY

## 2023-11-30 ENCOUNTER — Encounter: Payer: Self-pay | Admitting: Family Medicine

## 2024-08-31 ENCOUNTER — Emergency Department (HOSPITAL_BASED_OUTPATIENT_CLINIC_OR_DEPARTMENT_OTHER)

## 2024-08-31 ENCOUNTER — Other Ambulatory Visit: Payer: Self-pay

## 2024-08-31 ENCOUNTER — Encounter (HOSPITAL_BASED_OUTPATIENT_CLINIC_OR_DEPARTMENT_OTHER): Payer: Self-pay | Admitting: Emergency Medicine

## 2024-08-31 ENCOUNTER — Emergency Department (HOSPITAL_BASED_OUTPATIENT_CLINIC_OR_DEPARTMENT_OTHER)
Admission: EM | Admit: 2024-08-31 | Discharge: 2024-08-31 | Disposition: A | Discharge status: Home / Self Care | Attending: Emergency Medicine | Admitting: Emergency Medicine

## 2024-08-31 DIAGNOSIS — K824 Cholesterolosis of gallbladder: Secondary | ICD-10-CM | POA: Insufficient documentation

## 2024-08-31 DIAGNOSIS — R1011 Right upper quadrant pain: Secondary | ICD-10-CM | POA: Diagnosis present

## 2024-08-31 DIAGNOSIS — N200 Calculus of kidney: Secondary | ICD-10-CM | POA: Insufficient documentation

## 2024-08-31 DIAGNOSIS — R109 Unspecified abdominal pain: Secondary | ICD-10-CM

## 2024-08-31 LAB — CBC WITH DIFFERENTIAL/PLATELET
Abs Immature Granulocytes: 0.01 K/uL (ref 0.00–0.07)
Basophils Absolute: 0 K/uL (ref 0.0–0.1)
Basophils Relative: 1 %
Eosinophils Absolute: 0.1 K/uL (ref 0.0–0.5)
Eosinophils Relative: 1 %
HCT: 37.3 % (ref 36.0–46.0)
Hemoglobin: 13.3 g/dL (ref 12.0–15.0)
Immature Granulocytes: 0 %
Lymphocytes Relative: 33 %
Lymphs Abs: 1.7 K/uL (ref 0.7–4.0)
MCH: 30.9 pg (ref 26.0–34.0)
MCHC: 35.7 g/dL (ref 30.0–36.0)
MCV: 86.5 fL (ref 80.0–100.0)
Monocytes Absolute: 0.4 K/uL (ref 0.1–1.0)
Monocytes Relative: 7 %
Neutro Abs: 3.1 K/uL (ref 1.7–7.7)
Neutrophils Relative %: 58 %
Platelets: 261 K/uL (ref 150–400)
RBC: 4.31 MIL/uL (ref 3.87–5.11)
RDW: 13.2 % (ref 11.5–15.5)
WBC: 5.3 K/uL (ref 4.0–10.5)
nRBC: 0 % (ref 0.0–0.2)

## 2024-08-31 LAB — COMPREHENSIVE METABOLIC PANEL WITH GFR
ALT: 18 U/L (ref 0–44)
AST: 17 U/L (ref 15–41)
Albumin: 4.5 g/dL (ref 3.5–5.0)
Alkaline Phosphatase: 62 U/L (ref 38–126)
Anion gap: 11 (ref 5–15)
BUN: 9 mg/dL (ref 6–20)
CO2: 22 mmol/L (ref 22–32)
Calcium: 9.4 mg/dL (ref 8.9–10.3)
Chloride: 105 mmol/L (ref 98–111)
Creatinine, Ser: 0.69 mg/dL (ref 0.44–1.00)
GFR, Estimated: >60 mL/min (ref >60)
Glucose, Bld: 84 mg/dL (ref 70–99)
Potassium: 4 mmol/L (ref 3.5–5.1)
Sodium: 138 mmol/L (ref 135–145)
Total Bilirubin: 0.5 mg/dL (ref 0.0–1.2)
Total Protein: 7.2 g/dL (ref 6.5–8.1)

## 2024-08-31 LAB — URINALYSIS, ROUTINE W REFLEX MICROSCOPIC
Bilirubin Urine: NEGATIVE
Glucose, UA: NEGATIVE mg/dL
Hgb urine dipstick: NEGATIVE
Ketones, ur: NEGATIVE mg/dL
Leukocytes,Ua: NEGATIVE
Nitrite: NEGATIVE
Protein, ur: NEGATIVE mg/dL
Specific Gravity, Urine: 1.025 (ref 1.005–1.030)
pH: 5.5 (ref 5.0–8.0)

## 2024-08-31 LAB — LIPASE, BLOOD: Lipase: 16 U/L (ref 11–51)

## 2024-08-31 LAB — PREGNANCY, URINE: Preg Test, Ur: NEGATIVE

## 2024-08-31 MED ORDER — KETOROLAC TROMETHAMINE 15 MG/ML IJ SOLN
15.0000 mg | Freq: Once | INTRAMUSCULAR | Status: AC
  Administered 2024-08-31: 15 mg via INTRAVENOUS
  Filled 2024-08-31: qty 1

## 2024-08-31 MED ORDER — IOHEXOL 300 MG/ML  SOLN
100.0000 mL | Freq: Once | INTRAMUSCULAR | Status: AC | PRN
Start: 2024-08-31 — End: 2024-08-31
  Administered 2024-08-31: 100 mL via INTRAVENOUS

## 2024-08-31 MED ORDER — HYDROMORPHONE HCL 1 MG/ML IJ SOLN
1.0000 mg | Freq: Once | INTRAMUSCULAR | Status: AC
  Administered 2024-08-31: 1 mg via INTRAVENOUS
  Filled 2024-08-31: qty 1

## 2024-08-31 MED ORDER — PANTOPRAZOLE SODIUM 40 MG PO TBEC
40.0000 mg | DELAYED_RELEASE_TABLET | Freq: Every day | ORAL | 0 refills | Status: DC
Start: 2024-08-31 — End: 2024-09-18

## 2024-08-31 MED ORDER — SODIUM CHLORIDE 0.9 % IV BOLUS
1000.0000 mL | Freq: Once | INTRAVENOUS | Status: AC
  Administered 2024-08-31: 1000 mL via INTRAVENOUS

## 2024-08-31 MED ORDER — SUCRALFATE 1 G PO TABS: 1.0000 g | ORAL_TABLET | Freq: Three times a day (TID) | ORAL | 0 refills | Status: DC

## 2024-08-31 MED ORDER — ONDANSETRON HCL 4 MG PO TABS: 4.0000 mg | ORAL_TABLET | Freq: Three times a day (TID) | ORAL | 0 refills | Status: DC | PRN

## 2024-08-31 MED ORDER — ONDANSETRON HCL 4 MG/2ML IJ SOLN
4.0000 mg | Freq: Once | INTRAMUSCULAR | Status: AC
  Administered 2024-08-31: 4 mg via INTRAVENOUS
  Filled 2024-08-31: qty 2

## 2024-08-31 NOTE — ED Notes (Signed)
 Urine has been taken to the lab.

## 2024-08-31 NOTE — ED Notes (Signed)
 Pt. Reports she has had shoulder pain in the R shoulder for 2 days.  Pt. Reports no injury and does have vomiting and indigestion.  Pt. Feels just bad she reports.  Reports diarrhea yesterday.

## 2024-08-31 NOTE — Discharge Instructions (Addendum)
 There are many causes of abdominal pain. Most pain is not serious and goes away, but some pain gets worse, changes, or will not go away. Please return to the emergency department or see your doctor right away if you (or your family member) experience any of the following:  1. Pain that gets worse or moves to just one spot.  2. Pain that gets worse if you cough or sneeze.  3. Pain with going over a bump in the road.  4. Pain that does not get better in 24 hours.  5. Inability to keep down liquids (vomiting)-especially if you are making less urine.  6. Fainting.  7. Blood in the vomit or stool.  8. High fever or shaking chills.  9. Swelling of the abdomen.  10. Any new or worsening problem.      Follow-up Instructions  Return to the emergency department for recheck if worse.  See your primary care provider if not completely better in the next 2-3 days. Come to the ED if you are unable to see them in this time frame.  Please follow up with gastroenterology if symptoms persist   Additional Instructions  No alcohol.  No caffeine, aspirin, or cigarettes. Eat a very bland diet, avoid fatty/fried foods   Please return to the emergency department immediately for any new or concerning symptoms, or if you get worse.    Your imaging did show a small polyp in your gallbladder which is likely benign.  There is also a small stone in your kidney which is likely not causing any problems

## 2024-08-31 NOTE — ED Triage Notes (Signed)
 RUQ pain x 3 days , right shoulder pain , nausea , vomited x 3 . Indigestion x 3 days /

## 2024-08-31 NOTE — ED Provider Notes (Signed)
 [place] [facility] AT [facility] Provider Note  CSN: [id number] Arrival date & time: 08/31/24 1022  Chief Complaint(s) Abdominal Pain  HPI Whitney Smith is a 43 y.o. female with past medical history as below, significant for endometriosis, PCOS, migraines, hysterectomy who presents to the ED with complaint of abdominal pain  Began feeling unwell approximate 2 days ago.  Initially began with right shoulder pain intermittent.  Now having sharp, stabbing right upper quadrant abdominal pain, postprandial, nausea and vomiting.  Low-grade fever since yesterday.  No significant change to bowel or bladder function.  Last p.o. intake was last night.  No blood thinners.  Her surgical history includes hysterectomy  Past Medical History Past Medical History:  Diagnosis Date   Endometriosis    Headache(784.0)    Kidney infection    Migraines    PCOS (polycystic ovarian syndrome)    Patient Active Problem List   Diagnosis Date Noted   Perimenopausal vasomotor symptoms 10/13/2023   Eczema 11/05/2022   Breast pain, left 12/03/2021   COVID-19 vaccination declined 02/14/2021   Need for hepatitis C screening test 02/13/2021   Grief 03/01/2019   Seborrheic dermatitis of scalp 11/28/2018   Renal stone 03/08/2018   Hair loss 02/24/2018   Adjustment reaction with anxiety and depression 02/24/2018   PCOS (polycystic ovarian syndrome) 01/20/2017   Visual field constriction of left eye 11/26/2016   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 Authorizing Provider  ondansetron  (ZOFRAN ) 4 MG tablet  Take 1 tablet (4 mg total) by mouth every 8 (eight) hours as needed for nausea or vomiting. 08/31/24  Yes Elnor Savant A, DO  pantoprazole (PROTONIX) 40 MG tablet Take 1 tablet (40 mg total) by mouth daily for 14 days. 08/31/24 09/14/24 Yes Elnor Savant A, DO  sucralfate (CARAFATE) 1 g tablet Take 1 tablet (1 g total) by mouth with breakfast, with lunch, and with evening meal for 7 days. 08/31/24 09/07/24 Yes Elnor Savant LABOR, DO  buPROPion  (WELLBUTRIN  XL) 150 MG 24 hr tablet Take 1 tablet (150 mg total) by mouth daily. 09/02/23   Chandra Toribio POUR, MD  LORazepam  (ATIVAN ) 0.5 MG tablet Take 1 tablet (0.5 mg total) by mouth 2 (two) times daily as needed for anxiety. 11/05/22   Scarlet Elsie LABOR, MD  tirzepatide  (MOUNJARO ) 2.5 MG/0.5ML Pen Inject 2.5 mg into the skin once a week. 11/05/22   Scarlet Elsie LABOR, MD  valACYclovir  (VALTREX ) 1000 MG tablet Take 1 tablet (1,000 mg total) by mouth daily as needed. outbreaks 09/02/23   Chandra Toribio POUR, MD  Past Surgical History Past Surgical History:  Procedure Laterality Date   ABDOMINAL HYSTERECTOMY     BREAST BIOPSY Left 11/26/2023   US  LT BREAST BX W LOC DEV 1ST LESION IMG BX SPEC US  GUIDE 11/26/2023 [facility] MAMMOGRAPHY   ENDOMETRIAL ABLATION     TUBAL LIGATION     Family History Family History  Problem Relation Age of Onset   Cancer Mother    Diabetes Father    Breast cancer Maternal Aunt    Breast cancer Maternal Aunt    Breast cancer Maternal Grandmother    Diabetes Maternal Grandmother    Cancer Maternal Grandmother     Social History Social History   Tobacco Use   Smoking status: Never   Smokeless tobacco: Never  Vaping Use   Vaping status: Never Used  Substance Use Topics   Alcohol use: No   Drug use: No   Allergies Penicillins, Sulfamethoxazole, Codeine phosphate, and Metformin  and related  Review of  Systems A thorough review of systems was obtained and all systems are negative except as noted in the HPI and PMH.   Physical Exam Vital Signs  I have reviewed the triage vital signs BP 122/62   Pulse 60   Temp 97.8 F (36.6 C)   Resp 15   SpO2 98%  Physical Exam Vitals and nursing note reviewed.  Constitutional:      General: She is not in acute distress.    Appearance: Normal appearance. She is well-developed. She is obese. She is not ill-appearing.  HENT:     Head: Normocephalic and atraumatic.     Right Ear: External ear normal.     Left Ear: External ear normal.     Nose: Nose normal.     Mouth/Throat:     Mouth: Mucous membranes are moist.  Eyes:     General: No scleral icterus.       Right eye: No discharge.        Left eye: No discharge.  Cardiovascular:     Rate and Rhythm: Normal rate.  Pulmonary:     Effort: Pulmonary effort is normal. No respiratory distress.     Breath sounds: No stridor.  Abdominal:     General: Abdomen is flat. There is no distension.     Palpations: Abdomen is soft.     Tenderness: There is abdominal tenderness in the right upper quadrant. There is no guarding. Positive signs include Murphy's sign.  Musculoskeletal:        General: No deformity.     Cervical back: No rigidity.  Skin:    General: Skin is warm and dry.     Coloration: Skin is not cyanotic, jaundiced or pale.  Neurological:     Mental Status: She is alert.  Psychiatric:        Speech: Speech normal.        Behavior: Behavior normal. Behavior is cooperative.     ED Results and Treatments Labs (all labs ordered are listed, but only abnormal results are displayed) Labs Reviewed  URINALYSIS, ROUTINE W REFLEX MICROSCOPIC - Abnormal; Notable for the following components:      Result Value   APPearance CLOUDY (*)    All other components within normal limits  CBC WITH DIFFERENTIAL/PLATELET  COMPREHENSIVE METABOLIC PANEL WITH GFR  LIPASE, BLOOD  PREGNANCY, URINE  Radiology No results found. RUQ u/s and CTAP results reviewed  Pertinent labs & imaging results that were available during my care of the patient were reviewed by me and considered in my medical decision making (see MDM for details).  Medications Ordered in ED Medications  ondansetron  (ZOFRAN ) injection 4 mg (4 mg Intravenous Given 08/31/24 1110)  HYDROmorphone  (DILAUDID ) injection 1 mg (1 mg Intravenous Given 08/31/24 1111)  sodium chloride  0.9 % bolus 1,000 mL (0 mLs Intravenous Stopped 08/31/24 1421)  iohexol  (OMNIPAQUE ) 300 MG/ML solution 100 mL (100 mLs Intravenous Contrast Given 08/31/24 1326)  ketorolac  (TORADOL ) 15 MG/ML injection 15 mg (15 mg Intravenous Given 08/31/24 1541)                                                                                                                                     Procedures Procedures  (including critical care time)  Medical Decision Making / ED Course    Medical Decision Making:    Whitney Smith is a 43 y.o. female with past medical history as below, significant for endometriosis, PCOS, migraines, hysterectomy who presents to the ED with complaint of abdominal pain. The complaint involves an extensive differential diagnosis and also carries with it a high risk of complications and morbidity.  Serious etiology was considered. Ddx includes but is not limited to: Differential diagnosis includes but is not exclusive to acute cholecystitis, intrathoracic causes for epigastric abdominal pain, gastritis, duodenitis, pancreatitis, small bowel or large bowel obstruction, abdominal aortic aneurysm, hernia, gastritis, etc.   Complete initial physical exam performed, notably the patient was in no acute distress, appears uncomfortable.    Reviewed and confirmed nursing documentation for past medical history, family history, social  history.  Vital signs reviewed.    Right upper quad abdominal pain> - Right quad abdominal pain, postprandial, right shoulder pain. - CT and right upper quadrant ultrasound are stable - Labs are stable - Symptoms have improved, she is tolerant p.o. without difficulty.  No further nausea.  Concern for possible PUD or gastritis as etiology of her symptoms today.  No obvious surgical etiology.  Provide abdominal pain return precautions.  Bland diet, close follow-up PCP was encouraged   Clinical Course as of 09/02/24 1037  Thu Aug 31, 2024  1430 CTAP stable [SG]    Clinical Course User Index [SG] Elnor Jayson LABOR, DO      I have discussed the diagnosis/risks/treatment options with the patient.  Evaluation and diagnostic testing in the emergency department does not suggest an emergent condition requiring admission or immediate intervention beyond what has been performed at this time.  They will follow up with PCP. We also discussed returning to the ED immediately if new or worsening sx occur. We discussed the sx which are most concerning (e.g., sudden worsening pain, fever, inability to tolerate by mouth, blood in stool or emesis) that necessitate immediate return.    The patient appears  reasonably screened and/or stabilized for discharge and I doubt any other medical condition or other [facility] requiring further screening, evaluation, or treatment in the ED at this time prior to discharge.                 Additional history obtained: -Additional history obtained from na -External records from outside source obtained and reviewed including: Chart review including previous notes, labs, imaging, consultation notes including  Allergy list, prior labs   Lab Tests: -I ordered, reviewed, and interpreted labs.   The pertinent results include:   Labs Reviewed  URINALYSIS, ROUTINE W REFLEX MICROSCOPIC - Abnormal; Notable for the following components:      Result Value   APPearance CLOUDY  (*)    All other components within normal limits  CBC WITH DIFFERENTIAL/PLATELET  COMPREHENSIVE METABOLIC PANEL WITH GFR  LIPASE, BLOOD  PREGNANCY, URINE    Notable for labs are stable  EKG   EKG Interpretation Date/Time:  Thursday August 31 2024 10:37:30 EST Ventricular Rate:  71 PR Interval:  151 QRS Duration:  94 QT Interval:  407 QTC Calculation: 443 R Axis:   74  Text Interpretation: Sinus arrhythmia Confirmed by Elnor Savant ([id number]) on 08/31/2024 10:58:08 AM         Imaging Studies ordered: I ordered imaging studies including ultrasound, CT abdomen I independently visualized the following imaging with scope of interpretation limited to determining acute life threatening conditions related to emergency care; findings noted above I agree with the radiologist interpretation If any imaging was obtained with contrast I closely monitored patient for any possible adverse reaction a/w contrast administration in the emergency department   Medicines ordered and prescription drug management: Meds ordered this encounter  Medications   ondansetron  (ZOFRAN ) injection 4 mg   HYDROmorphone  (DILAUDID ) injection 1 mg   sodium chloride  0.9 % bolus 1,000 mL   iohexol  (OMNIPAQUE ) 300 MG/ML solution 100 mL   pantoprazole (PROTONIX) 40 MG tablet    Sig: Take 1 tablet (40 mg total) by mouth daily for 14 days.    Dispense:  14 tablet    Refill:  0   sucralfate (CARAFATE) 1 g tablet    Sig: Take 1 tablet (1 g total) by mouth with breakfast, with lunch, and with evening meal for 7 days.    Dispense:  21 tablet    Refill:  0   ondansetron  (ZOFRAN ) 4 MG tablet    Sig: Take 1 tablet (4 mg total) by mouth every 8 (eight) hours as needed for nausea or vomiting.    Dispense:  12 tablet    Refill:  0   ketorolac  (TORADOL ) 15 MG/ML injection 15 mg    -I have reviewed the patients home medicines and have made adjustments as needed   Consultations Obtained: Not applicable  Cardiac  Monitoring: Continuous pulse oximetry interpreted by myself, 100% on RA.    Social Determinants of Health:  Diagnosis or treatment significantly limited by social determinants of health: obesity   Reevaluation: After the interventions noted above, I reevaluated the patient and found that they have improved  Co morbidities that complicate the patient evaluation  Past Medical History:  Diagnosis Date   Endometriosis    Headache(784.0)    Kidney infection    Migraines    PCOS (polycystic ovarian syndrome)       Dispostion: Disposition decision including need for hospitalization was considered, and patient discharged from emergency department.    Final Clinical Impression(s) / ED Diagnoses Final diagnoses:  Abdominal pain, unspecified abdominal location  Gallbladder polyp  Renal calculus        Elnor Jayson LABOR, DO 09/02/24 1038

## 2024-08-31 NOTE — ED Notes (Signed)
 Per EDP  Pt. Was given crackers and ginger ale.  Pt. In no distress and has call bell at bedside.

## 2024-09-16 ENCOUNTER — Emergency Department (HOSPITAL_COMMUNITY)

## 2024-09-16 ENCOUNTER — Emergency Department (HOSPITAL_COMMUNITY)
Admission: EM | Admit: 2024-09-16 | Discharge: 2024-09-16 | Disposition: A | Discharge status: Home / Self Care | Attending: Emergency Medicine | Admitting: Emergency Medicine

## 2024-09-16 DIAGNOSIS — M25572 Pain in left ankle and joints of left foot: Secondary | ICD-10-CM | POA: Insufficient documentation

## 2024-09-16 DIAGNOSIS — W109XXA Fall (on) (from) unspecified stairs and steps, initial encounter: Secondary | ICD-10-CM | POA: Diagnosis not present

## 2024-09-16 DIAGNOSIS — S82831A Other fracture of upper and lower end of right fibula, initial encounter for closed fracture: Secondary | ICD-10-CM | POA: Insufficient documentation

## 2024-09-16 DIAGNOSIS — M25571 Pain in right ankle and joints of right foot: Secondary | ICD-10-CM | POA: Diagnosis present

## 2024-09-16 DIAGNOSIS — Y92009 Unspecified place in unspecified non-institutional (private) residence as the place of occurrence of the external cause: Secondary | ICD-10-CM | POA: Diagnosis not present

## 2024-09-16 DIAGNOSIS — S82891A Other fracture of right lower leg, initial encounter for closed fracture: Secondary | ICD-10-CM

## 2024-09-16 MED ORDER — NAPROXEN 375 MG PO TABS
375.0000 mg | ORAL_TABLET | Freq: Two times a day (BID) | ORAL | 0 refills | Status: AC
Start: 2024-09-16 — End: ?

## 2024-09-16 MED ORDER — ONDANSETRON 8 MG PO TBDP: 8.0000 mg | ORAL_TABLET | Freq: Three times a day (TID) | ORAL | 0 refills | Status: DC | PRN

## 2024-09-16 MED ORDER — HYDROCODONE-ACETAMINOPHEN 5-325 MG PO TABS: 1.0000 | ORAL_TABLET | ORAL | 0 refills | Status: DC | PRN

## 2024-09-16 MED ORDER — HYDROCODONE-ACETAMINOPHEN 5-325 MG PO TABS
1.0000 | ORAL_TABLET | Freq: Once | ORAL | Status: AC
  Administered 2024-09-16: 1 via ORAL
  Filled 2024-09-16: qty 1

## 2024-09-16 NOTE — ED Provider Notes (Signed)
 [place] [facility] DEPARTMENT AT [facility] Provider Note   CSN: [id number] Arrival date & time: 09/16/24  1440     Patient presents with: No chief complaint on file.   Whitney Smith is a 43 y.o. female.  {Add pertinent medical, surgical, social history, OB history to HPI:32947} HPI   And has a history of headache, endometriosis, polycystic ovarian syndrome, migraines.  Patient states she fell down a couple steps in her home and rolled both ankles.  Patient states she is having some discomfort in her left ankle but does not feel like she could put any weight on the right ankle and has noticed more swelling.  Patient states she does have some pain that extends up her tibia on that right side.  She denies any other injuries.  No back pain.  Prior to Admission medications   Medication Sig Start Date End Date Taking? Authorizing Provider  buPROPion  (WELLBUTRIN  XL) 150 MG 24 hr tablet Take 1 tablet (150 mg total) by mouth daily. 09/02/23   Chandra Toribio POUR, MD  LORazepam  (ATIVAN ) 0.5 MG tablet Take 1 tablet (0.5 mg total) by mouth 2 (two) times daily as needed for anxiety. 11/05/22   Scarlet Elsie LABOR, MD  ondansetron  (ZOFRAN ) 4 MG tablet Take 1 tablet (4 mg total) by mouth every 8 (eight) hours as needed for nausea or vomiting. 08/31/24   Elnor Jayson LABOR, DO  pantoprazole  (PROTONIX ) 40 MG tablet Take 1 tablet (40 mg total) by mouth daily for 14 days. 08/31/24 09/14/24  Elnor Jayson A, DO  sucralfate  (CARAFATE ) 1 g tablet Take 1 tablet (1 g total) by mouth with breakfast, with lunch, and with evening meal for 7 days. 08/31/24 09/07/24  Elnor Jayson LABOR, DO  tirzepatide  (MOUNJARO ) 2.5 MG/0.5ML Pen Inject 2.5 mg into the skin once a week. 11/05/22   Scarlet Elsie LABOR, MD  valACYclovir  (VALTREX ) 1000 MG tablet Take 1 tablet (1,000 mg total) by mouth daily as needed. outbreaks 09/02/23   Chandra Toribio POUR, MD    Allergies: Penicillins, Sulfamethoxazole, Codeine phosphate, and Metformin  and  related    Review of Systems  Updated Vital Signs BP (!) 155/133 (BP Location: Left Arm)   Pulse (!) 105   Temp 98.4 F (36.9 C) (Oral)   Resp 20   SpO2 100%   Physical Exam Vitals and nursing note reviewed.  Constitutional:      General: She is not in acute distress.    Appearance: She is well-developed.  HENT:     Head: Normocephalic and atraumatic.     Right Ear: External ear normal.     Left Ear: External ear normal.  Eyes:     General: No scleral icterus.       Right eye: No discharge.        Left eye: No discharge.     Conjunctiva/sclera: Conjunctivae normal.  Neck:     Trachea: No tracheal deviation.  Cardiovascular:     Rate and Rhythm: Normal rate.  Pulmonary:     Effort: Pulmonary effort is normal. No respiratory distress.     Breath sounds: No stridor.  Abdominal:     General: There is no distension.  Musculoskeletal:        General: Swelling, tenderness and deformity present.     Cervical back: Neck supple.     Lumbar back: No tenderness.     Right knee: Normal.     Left knee: Normal.     Right ankle: Tenderness  present. Decreased range of motion.     Left ankle: Tenderness present. Normal range of motion.     Right foot: Normal.     Left foot: Normal.  Skin:    General: Skin is warm and dry.     Findings: No rash.  Neurological:     Mental Status: She is alert. Mental status is at baseline.     Cranial Nerves: No dysarthria or facial asymmetry.     Motor: No seizure activity.     (all labs ordered are listed, but only abnormal results are displayed) Labs Reviewed - No data to display  EKG: None  Radiology: No results found.  {Document cardiac monitor, telemetry assessment procedure when appropriate:32947} Procedures   Medications Ordered in the ED  HYDROcodone -acetaminophen  (NORCO/VICODIN) 5-325 MG per tablet 1 tablet (has no administration in time range)      {Click here for ABCD2, HEART and other calculators REFRESH Note before  signing:1}                              Medical Decision Making Amount and/or Complexity of Data Reviewed Radiology: ordered.  Risk Prescription drug management.   ***  {Document critical care time when appropriate  Document review of labs and clinical decision tools ie CHADS2VASC2, etc  Document your independent review of radiology images and any outside records  Document your discussion with family members, caretakers and with consultants  Document social determinants of health affecting pt's care  Document your decision making why or why not admission, treatments were needed:32947:::1}   Final diagnoses:  None    ED Discharge Orders     None

## 2024-09-16 NOTE — Progress Notes (Signed)
 Orthopedic Tech Progress Note Patient Details:  Whitney Smith 12-18-1980 [id number]  Ortho Devices Type of Ortho Device: Short leg splint, Sugartong splint, Crutches Ortho Device/Splint Location: RLE posterior w/stirrup Ortho Device/Splint Interventions: Ordered, Application, Adjustment   Post Interventions Patient Tolerated: Well Instructions Provided: Care of device  Whitney Smith 09/16/2024, 5:45 PM

## 2024-09-16 NOTE — ED Triage Notes (Signed)
 Patient fell down her front stairs of her home and rolled both ankles. Patient can bare weight on left foot but unable to bare weight on right foot. Right ankle swollen, palpable pedal pulse. Rates 10/10.

## 2024-09-16 NOTE — Discharge Instructions (Signed)
 Take the medications to help with pain and discomfort.  You can also apply ice to help with swelling.  Try to keep your leg elevated.  Follow-up with an orthopedic doctor for further evaluation.  Call the office to schedule an appointment

## 2024-09-16 NOTE — ED Notes (Signed)
 Urine sample collected and sent to lab.

## 2024-09-18 ENCOUNTER — Encounter (HOSPITAL_BASED_OUTPATIENT_CLINIC_OR_DEPARTMENT_OTHER): Payer: Self-pay | Admitting: Orthopedic Surgery

## 2024-09-18 ENCOUNTER — Other Ambulatory Visit: Payer: Self-pay
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
## 2024-09-26 ENCOUNTER — Ambulatory Visit (HOSPITAL_BASED_OUTPATIENT_CLINIC_OR_DEPARTMENT_OTHER)
Admission: RE | Admit: 2024-09-26 | Discharge: 2024-09-26 | Disposition: A | Discharge status: Home / Self Care | Attending: Orthopedic Surgery | Admitting: Orthopedic Surgery

## 2024-09-26 ENCOUNTER — Ambulatory Visit (HOSPITAL_BASED_OUTPATIENT_CLINIC_OR_DEPARTMENT_OTHER): Admitting: Certified Registered"

## 2024-09-26 ENCOUNTER — Encounter (HOSPITAL_BASED_OUTPATIENT_CLINIC_OR_DEPARTMENT_OTHER)
Admission: RE | Disposition: A | Discharge status: Home / Self Care | Payer: Self-pay | Source: Home / Self Care | Attending: Orthopedic Surgery

## 2024-09-26 ENCOUNTER — Encounter (HOSPITAL_BASED_OUTPATIENT_CLINIC_OR_DEPARTMENT_OTHER): Payer: Self-pay | Admitting: Orthopedic Surgery

## 2024-09-26 ENCOUNTER — Ambulatory Visit (HOSPITAL_BASED_OUTPATIENT_CLINIC_OR_DEPARTMENT_OTHER)

## 2024-09-26 ENCOUNTER — Other Ambulatory Visit: Payer: Self-pay

## 2024-09-26 HISTORY — PX: ORIF FIBULA FRACTURE: SHX5114

## 2024-09-26 SURGERY — OPEN REDUCTION INTERNAL FIXATION (ORIF) FIBULA FRACTURE
Anesthesia: General | Site: Ankle | Laterality: Right

## 2024-09-26 MED ORDER — FENTANYL CITRATE (PF) 100 MCG/2ML IJ SOLN
INTRAMUSCULAR | Status: AC
  Filled 2024-09-26: qty 2

## 2024-09-26 MED ORDER — ONDANSETRON HCL 4 MG/2ML IJ SOLN
INTRAMUSCULAR | Status: DC | PRN
  Administered 2024-09-26: 4 mg via INTRAVENOUS

## 2024-09-26 MED ORDER — EPHEDRINE 5 MG/ML INJ
INTRAVENOUS | Status: AC
  Filled 2024-09-26: qty 5

## 2024-09-26 MED ORDER — SODIUM CHLORIDE 0.9 % IV SOLN
12.5000 mg | INTRAVENOUS | Status: DC | PRN
  Filled 2024-09-26: qty 0.5

## 2024-09-26 MED ORDER — BACLOFEN 10 MG PO TABS: 10.0000 mg | ORAL_TABLET | Freq: Three times a day (TID) | ORAL | 0 refills | Status: AC

## 2024-09-26 MED ORDER — ACETAMINOPHEN 500 MG PO TABS
1000.0000 mg | ORAL_TABLET | Freq: Once | ORAL | Status: AC
  Administered 2024-09-26: 1000 mg via ORAL

## 2024-09-26 MED ORDER — SENNA-DOCUSATE SODIUM 8.6-50 MG PO TABS: 2.0000 | ORAL_TABLET | Freq: Every day | ORAL | 1 refills | Status: AC

## 2024-09-26 MED ORDER — OXYCODONE HCL 5 MG PO TABS: 5.0000 mg | ORAL_TABLET | ORAL | 0 refills | Status: AC | PRN

## 2024-09-26 MED ORDER — POVIDONE-IODINE 10 % EX SWAB
2.0000 | Freq: Once | CUTANEOUS | Status: AC
  Administered 2024-09-26: 2 via TOPICAL

## 2024-09-26 MED ORDER — LACTATED RINGERS IV SOLN: INTRAVENOUS | Status: DC

## 2024-09-26 MED ORDER — OXYCODONE HCL 5 MG/5ML PO SOLN: 5.0000 mg | Freq: Once | ORAL | Status: DC | PRN

## 2024-09-26 MED ORDER — BUPIVACAINE-EPINEPHRINE (PF) 0.5% -1:200000 IJ SOLN
INTRAMUSCULAR | Status: DC | PRN
  Administered 2024-09-26: 30 mL via PERINEURAL

## 2024-09-26 MED ORDER — ARTIFICIAL TEARS OPHTHALMIC OINT
TOPICAL_OINTMENT | OPHTHALMIC | Status: AC
  Filled 2024-09-26: qty 3.5

## 2024-09-26 MED ORDER — FENTANYL CITRATE (PF) 100 MCG/2ML IJ SOLN
50.0000 ug | Freq: Once | INTRAMUSCULAR | Status: AC
  Administered 2024-09-26: 50 ug via INTRAVENOUS

## 2024-09-26 MED ORDER — LIDOCAINE HCL (CARDIAC) PF 100 MG/5ML IV SOSY
PREFILLED_SYRINGE | INTRAVENOUS | Status: DC | PRN
  Administered 2024-09-26: 60 mg via INTRAVENOUS

## 2024-09-26 MED ORDER — AMISULPRIDE (ANTIEMETIC) 5 MG/2ML IV SOLN: 10.0000 mg | Freq: Once | INTRAVENOUS | Status: DC | PRN

## 2024-09-26 MED ORDER — ACETAMINOPHEN 500 MG PO TABS: 1000.0000 mg | ORAL_TABLET | Freq: Once | ORAL | Status: AC

## 2024-09-26 MED ORDER — ONDANSETRON HCL 4 MG PO TABS: 4.0000 mg | ORAL_TABLET | Freq: Three times a day (TID) | ORAL | 0 refills | Status: AC | PRN

## 2024-09-26 MED ORDER — MIDAZOLAM HCL (PF) 2 MG/2ML IJ SOLN
2.0000 mg | Freq: Once | INTRAMUSCULAR | Status: AC
  Administered 2024-09-26: 2 mg via INTRAVENOUS

## 2024-09-26 MED ORDER — FENTANYL CITRATE (PF) 100 MCG/2ML IJ SOLN
INTRAMUSCULAR | Status: DC | PRN
  Administered 2024-09-26 (×2): 50 ug via INTRAVENOUS

## 2024-09-26 MED ORDER — ONDANSETRON HCL 4 MG/2ML IJ SOLN
INTRAMUSCULAR | Status: AC
  Filled 2024-09-26: qty 2

## 2024-09-26 MED ORDER — DEXAMETHASONE SOD PHOSPHATE PF 10 MG/ML IJ SOLN
INTRAMUSCULAR | Status: DC | PRN
  Administered 2024-09-26: 10 mg via INTRAVENOUS

## 2024-09-26 MED ORDER — MIDAZOLAM HCL 2 MG/2ML IJ SOLN
INTRAMUSCULAR | Status: AC
  Filled 2024-09-26: qty 2

## 2024-09-26 MED ORDER — OXYCODONE HCL 5 MG PO TABS: 5.0000 mg | ORAL_TABLET | Freq: Once | ORAL | Status: DC | PRN

## 2024-09-26 MED ORDER — PHENYLEPHRINE 80 MCG/ML (10ML) SYRINGE FOR IV PUSH (FOR BLOOD PRESSURE SUPPORT)
PREFILLED_SYRINGE | INTRAVENOUS | Status: AC
  Filled 2024-09-26: qty 30

## 2024-09-26 MED ORDER — PROPOFOL 10 MG/ML IV BOLUS
INTRAVENOUS | Status: AC
  Filled 2024-09-26: qty 20

## 2024-09-26 MED ORDER — ACETAMINOPHEN 500 MG PO TABS
ORAL_TABLET | ORAL | Status: AC
  Filled 2024-09-26: qty 2

## 2024-09-26 MED ORDER — PHENYLEPHRINE HCL (PRESSORS) 10 MG/ML IV SOLN
INTRAVENOUS | Status: DC | PRN
  Administered 2024-09-26: 80 ug via INTRAVENOUS

## 2024-09-26 MED ORDER — POVIDONE-IODINE 7.5 % EX SOLN
Freq: Once | CUTANEOUS | Status: DC
  Filled 2024-09-26: qty 118

## 2024-09-26 MED ORDER — LIDOCAINE 2% (20 MG/ML) 5 ML SYRINGE
INTRAMUSCULAR | Status: AC
  Filled 2024-09-26: qty 5

## 2024-09-26 MED ORDER — CEFAZOLIN SODIUM-DEXTROSE 2-4 GM/100ML-% IV SOLN
INTRAVENOUS | Status: AC
  Filled 2024-09-26: qty 100

## 2024-09-26 MED ORDER — FENTANYL CITRATE (PF) 100 MCG/2ML IJ SOLN: 25.0000 ug | INTRAMUSCULAR | Status: DC | PRN

## 2024-09-26 MED ORDER — CEFAZOLIN SODIUM-DEXTROSE 2-4 GM/100ML-% IV SOLN
2.0000 g | INTRAVENOUS | Status: AC
  Administered 2024-09-26: 2 g via INTRAVENOUS

## 2024-09-26 MED ORDER — PROPOFOL 10 MG/ML IV BOLUS
INTRAVENOUS | Status: DC | PRN
  Administered 2024-09-26: 200 mg via INTRAVENOUS
  Administered 2024-09-26: 100 mg via INTRAVENOUS

## 2024-09-26 SURGICAL SUPPLY — 55 items
BIT DRILL 110X2.5XQCK CNCT (BIT) IMPLANT
BLADE SURG 15 STRL LF DISP TIS (BLADE) ×2 IMPLANT
BNDG COHESIVE 4X5 TAN STRL LF (GAUZE/BANDAGES/DRESSINGS) ×1 IMPLANT
BNDG COMPR ESMARK 6X3 LF (GAUZE/BANDAGES/DRESSINGS) ×1 IMPLANT
BNDG ELASTIC 4INX 5YD STR LF (GAUZE/BANDAGES/DRESSINGS) ×1 IMPLANT
BNDG ELASTIC 6INX 5YD STR LF (GAUZE/BANDAGES/DRESSINGS) IMPLANT
CANISTER SUCT 1200ML W/VALVE (MISCELLANEOUS) ×1 IMPLANT
CLSR STERI-STRIP ANTIMIC 1/2X4 (GAUZE/BANDAGES/DRESSINGS) IMPLANT
CUFF TRNQT CYL 34X4.125X (TOURNIQUET CUFF) IMPLANT
DRAPE EXTREMITY T 121X128X90 (DISPOSABLE) ×1 IMPLANT
DRAPE INCISE IOBAN 66X45 STRL (DRAPES) IMPLANT
DRAPE OEC MINIVIEW 54X84 (DRAPES) ×1 IMPLANT
DRAPE U-SHAPE 47X51 STRL (DRAPES) ×1 IMPLANT
DURAPREP 26ML APPLICATOR (WOUND CARE) ×1 IMPLANT
ELECTRODE REM PT RTRN 9FT ADLT (ELECTROSURGICAL) ×1 IMPLANT
GAUZE PAD ABD 8X10 STRL (GAUZE/BANDAGES/DRESSINGS) ×1 IMPLANT
GAUZE SPONGE 4X4 12PLY STRL (GAUZE/BANDAGES/DRESSINGS) ×1 IMPLANT
GLOVE BIO SURGEON STRL SZ7 (GLOVE) ×1 IMPLANT
GLOVE BIOGEL PI IND STRL 7.0 (GLOVE) ×1 IMPLANT
GLOVE BIOGEL PI IND STRL 8 (GLOVE) ×2 IMPLANT
GLOVE ORTHO TXT STRL SZ7.5 (GLOVE) ×1 IMPLANT
GOWN STRL REUS W/ TWL LRG LVL3 (GOWN DISPOSABLE) ×1 IMPLANT
GOWN STRL REUS W/ TWL XL LVL3 (GOWN DISPOSABLE) ×2 IMPLANT
PACK ARTHROSCOPY DSU (CUSTOM PROCEDURE TRAY) ×1 IMPLANT
PACK BASIN DAY SURGERY FS (CUSTOM PROCEDURE TRAY) ×1 IMPLANT
PAD CAST 4YDX4 CTTN HI CHSV (CAST SUPPLIES) ×1 IMPLANT
PADDING CAST ABS COTTON 4X4 ST (CAST SUPPLIES) IMPLANT
PADDING CAST COTTON 6X4 STRL (CAST SUPPLIES) IMPLANT
PENCIL SMOKE EVACUATOR (MISCELLANEOUS) ×1 IMPLANT
PLATE 5HOLE 1/3 TUBULAR (Plate) IMPLANT
SCREW CANC 2.5XFT 16X4XST SM (Screw) IMPLANT
SCREW CANCELLOUS 4.0X18 (Screw) IMPLANT
SCREW CORT 2.5X24X3.5XST SM (Screw) IMPLANT
SCREW CORTICAL 3.5 16MM (Screw) IMPLANT
SCREW CORTICAL 3.5X14 (Screw) IMPLANT
SLEEVE SCD COMPRESS KNEE MED (STOCKING) ×1 IMPLANT
SOLN 0.9% NACL POUR BTL 1000ML (IV SOLUTION) ×1 IMPLANT
SPIKE FLUID TRANSFER (MISCELLANEOUS) IMPLANT
SPLINT PLASTER CAST FAST 5X30 (CAST SUPPLIES) ×1 IMPLANT
SPONGE T-LAP 4X18 ~~LOC~~+RFID (SPONGE) ×1 IMPLANT
STAPLER SKIN PROX WIDE 3.9 (STAPLE) IMPLANT
SUCTION TUBE FRAZIER 10FR DISP (SUCTIONS) ×1 IMPLANT
SUT ETHILON 3 0 PS 1 (SUTURE) IMPLANT
SUT ETHILON 4 0 PS 2 18 (SUTURE) IMPLANT
SUT MNCRL AB 4-0 PS2 18 (SUTURE) IMPLANT
SUT VIC AB 2-0 CT1 TAPERPNT 27 (SUTURE) IMPLANT
SUT VIC AB 2-0 SH 18 (SUTURE) IMPLANT
SUT VIC AB 2-0 SH 27XBRD (SUTURE) IMPLANT
SUT VIC AB 4-0 PS2 18 (SUTURE) IMPLANT
SUT VICRYL 0 SH 27 (SUTURE) ×1 IMPLANT
SUT VICRYL 3-0 CR8 SH (SUTURE) ×1 IMPLANT
SYR BULB EAR ULCER 3OZ GRN STR (SYRINGE) ×1 IMPLANT
TOWEL GREEN STERILE FF (TOWEL DISPOSABLE) ×1 IMPLANT
UNDERPAD 30X36 HEAVY ABSORB (UNDERPADS AND DIAPERS) ×1 IMPLANT
YANKAUER SUCT BULB TIP NO VENT (SUCTIONS) IMPLANT

## 2024-09-26 NOTE — Interval H&P Note (Signed)
 History and Physical Interval Note:  09/26/2024 10:10 AM  Whitney Smith  has presented today for surgery, with the diagnosis of fracture of distal end of right fibula.  The various methods of treatment have been discussed with the patient and family. After consideration of risks, benefits and other options for treatment, the patient has consented to  Procedure(s): OPEN REDUCTION INTERNAL FIXATION (ORIF) FIBULA FRACTURE (Right) as a surgical intervention.  The patient's history has been reviewed, patient examined, no change in status, stable for surgery.  I have reviewed the patient's chart and labs.  Questions were answered to the patient's satisfaction.     Fonda SHAUNNA Olmsted

## 2024-09-26 NOTE — Op Note (Signed)
 09/26/2024  PATIENT:  Whitney Smith    PRE-OPERATIVE DIAGNOSIS:  right distal fibula fracture  POST-OPERATIVE DIAGNOSIS:  Same  PROCEDURE:    1.  Open Reduction Internal Fixation Fibula 2.  3 views plus a stress view of the right ankle  SURGEON:  Fonda SHAUNNA Olmsted, MD  PHYSICIAN ASSISTANT: Army Daring, PA-C, present and scrubbed throughout the case, critical for completion in a timely fashion, and for retraction, instrumentation, and closure.  ANESTHESIA:   General  ESTIMATED BLOOD LOSS: minimal  UNIQUE ASPECTS OF THE CASE:  bone quality was good.  The fibula was short.  The medial clear space widening corrected with the restoration of fibular length.    PREOPERATIVE INDICATIONS:  [name] is a  43 y.o. female with a diagnosis of fracture of distal end of right fibula who elected for surgical management to minimize the risk for malunion and nonunion and post-traumatic arthritis.    The risks benefits and alternatives were discussed with the patient preoperatively including but not limited to the risks of infection, bleeding, nerve injury, cardiopulmonary complications, the need for revision surgery, the need for hardware removal, among others, and the patient was willing to proceed.  OPERATIVE IMPLANTS: Biomet / Zimmer 1/3 tubular plate, with a single interfragmentary screw.  OPERATIVE PROCEDURE: The patient was brought to the operating room and placed in the supine position. All bony prominences were padded. General anesthesia was administered. The lower extremity was prepped and draped in the usual sterile fashion.  Tourniquet was set at 250 for approximately 30 minutes.  Time out was performed.   Incision was made over the distal fibula and the fracture was exposed and reduced anatomically with a clamp. An interfragmentary screw was placed. I then applied a 1/3 tubular plate and secured it proximally and distally non-locking screws. Bone quality was good. I used c-arm to  confirm satisfactory reduction and fixation.   The syndesmosis was stressed using live fluoroscopy and found to be stable.   The wounds were irrigated, and closed with vicryl with routine closure for the skin. The wounds were injected with local anesthetic. Sterile gauze was applied followed by a posterior splint. She was awakened and returned to the PACU in stable and satisfactory condition. There were no complications.  3-Views plus a stress view of the ankle demonstrated appropriate reconstruction of the mortise with internal fixation and appropriate stability of the syndesmosis.

## 2024-09-26 NOTE — Progress Notes (Signed)
 Assisted Dr. Mal Amabile with right, popliteal, ultrasound guided block. Side rails up, monitors on throughout procedure. See vital signs in flow sheet. Tolerated Procedure well.

## 2024-09-26 NOTE — Anesthesia Preprocedure Evaluation (Addendum)
 Anesthesia Evaluation  Patient identified by MRN, date of birth, ID band Patient awake    Reviewed: Allergy & Precautions, NPO status , Patient's Chart, lab work & pertinent test results  History of Anesthesia Complications Negative for: history of anesthetic complications  Airway Mallampati: II  TM Distance: >3 FB Neck ROM: Full    Dental  (+) Dental Advisory Given, Teeth Intact   Pulmonary neg pulmonary ROS   Pulmonary exam normal        Cardiovascular negative cardio ROS Normal cardiovascular exam     Neuro/Psych  Headaches        Adjustment reaction with anxiety and depression    GI/Hepatic negative GI ROS, Neg liver ROS,,,  Endo/Other   Obesity   Renal/GU negative Renal ROS     Musculoskeletal negative musculoskeletal ROS (+)    Abdominal   Peds  Hematology negative hematology ROS (+)   Anesthesia Other Findings HSV  Reproductive/Obstetrics  PCOS s/p hysterectomy s/p tubal ligation                               Anesthesia Physical Anesthesia Plan  ASA: 2  Anesthesia Plan: General   Post-op Pain Management: Regional block* and Tylenol  PO (pre-op)*   Induction: Intravenous  PONV Risk Score and Plan: 3 and Treatment may vary due to age or medical condition, Ondansetron , Dexamethasone  and Midazolam  Airway Management Planned: LMA  Additional Equipment: None  Intra-op Plan:   Post-operative Plan: Extubation in OR  Informed Consent: I have reviewed the patients History and Physical, chart, labs and discussed the procedure including the risks, benefits and alternatives for the proposed anesthesia with the patient or authorized representative who has indicated his/her understanding and acceptance.     Dental advisory given  Plan Discussed with: CRNA and Anesthesiologist  Anesthesia Plan Comments:          Anesthesia Quick Evaluation

## 2024-09-26 NOTE — Discharge Instructions (Addendum)
 Next dose of Tylenol  due anytime after 3pm today if needed.  Diet: As you were doing prior to hospitalization   Shower:  May shower but keep the wounds dry, use an occlusive plastic wrap, NO SOAKING IN TUB.  If the bandage gets wet, change with a clean dry gauze.  If you have a splint on, leave the splint in place and keep the splint dry with a plastic bag.  Dressing:  You may change your dressing 3-5 days after surgery, unless you have a splint.  If you have a splint, then just leave the splint in place and we will change your bandages during your first follow-up appointment.    If you had hand or foot surgery, we will plan to remove your stitches in about 2 weeks in the office.  For all other surgeries, there are sticky tapes (steri-strips) on your wounds and all the stitches are absorbable.  Leave the steri-strips in place when changing your dressings, they will peel off with time, usually 2-3 weeks.  Activity:  Increase activity slowly as tolerated, but follow the weight bearing instructions below.  The rules on driving is that you can not be taking narcotics while you drive, and you must feel in control of the vehicle.    Weight Bearing:   No bearing weight on right leg.    To prevent constipation: you may use a stool softener such as -  Colace (over the counter) 100 mg by mouth twice a day  Drink plenty of fluids (prune juice may be helpful) and high fiber foods Miralax (over the counter) for constipation as needed.    Itching:  If you experience itching with your medications, try taking only a single pain pill, or even half a pain pill at a time.  You may take up to 10 pain pills per day, and you can also use benadryl  over the counter for itching or also to help with sleep.   Precautions:  If you experience chest pain or shortness of breath - call 911 immediately for transfer to the hospital emergency department!!  If you develop a fever greater that 101 F, purulent drainage from  wound, increased redness or drainage from wound, or calf pain -- Call the office at [phone]                                                Follow- Up Appointment:  Please call for an appointment to be seen in 2 weeks [place] - [phone]   Post Anesthesia Home Care Instructions  Activity: Get plenty of rest for the remainder of the day. A responsible individual must stay with you for 24 hours following the procedure.  For the next 24 hours, DO NOT: -Drive a car -Advertising copywriter -Drink alcoholic beverages -Take any medication unless instructed by your physician -Make any legal decisions or sign important papers.  Meals: Start with liquid foods such as gelatin or soup. Progress to regular foods as tolerated. Avoid greasy, spicy, heavy foods. If nausea and/or vomiting occur, drink only clear liquids until the nausea and/or vomiting subsides. Call your physician if vomiting continues.  Special Instructions/Symptoms: Your throat may feel dry or sore from the anesthesia or the breathing tube placed in your throat during surgery. If this causes discomfort, gargle with warm salt water. The discomfort should disappear within 24 hours.  If you had a scopolamine patch placed behind your ear for the management of post- operative nausea and/or vomiting:  1. The medication in the patch is effective for 72 hours, after which it should be removed.  Wrap patch in a tissue and discard in the trash. Wash hands thoroughly with soap and water. 2. You may remove the patch earlier than 72 hours if you experience unpleasant side effects which may include dry mouth, dizziness or visual disturbances. 3. Avoid touching the patch. Wash your hands with soap and water after contact with the patch.

## 2024-09-26 NOTE — Transfer of Care (Signed)
 Immediate Anesthesia Transfer of Care Note  Patient: Whitney Smith  Procedure(s) Performed: OPEN REDUCTION INTERNAL FIXATION (ORIF) FIBULA FRACTURE (Right: Ankle)  Patient Location: PACU  Anesthesia Type:General  Level of Consciousness: awake, alert , and patient cooperative  Airway & Oxygen Therapy: Patient Spontanous Breathing and Patient connected to nasal cannula oxygen  Post-op Assessment: Report given to RN and Post -op Vital signs reviewed and stable  Post vital signs: Reviewed and stable  Last Vitals:  Vitals Value Taken Time  BP 130/64 09/26/24 12:20  Temp    Pulse 95 09/26/24 12:24  Resp 15 09/26/24 12:24  SpO2 95 % 09/26/24 12:24  Vitals shown include unfiled device data.  Last Pain:  Vitals:   09/26/24 0854  TempSrc: Temporal  PainSc: 4       Patients Stated Pain Goal: 4 (09/26/24 0854)  Complications: No notable events documented.

## 2024-09-26 NOTE — Anesthesia Postprocedure Evaluation (Signed)
 Anesthesia Post Note  Patient: Whitney Smith  Procedure(s) Performed: OPEN REDUCTION INTERNAL FIXATION (ORIF) FIBULA FRACTURE (Right: Ankle)     Patient location during evaluation: PACU Anesthesia Type: General Level of consciousness: awake and alert Pain management: pain level controlled Vital Signs Assessment: post-procedure vital signs reviewed and stable Respiratory status: spontaneous breathing, nonlabored ventilation and respiratory function stable Cardiovascular status: stable and blood pressure returned to baseline Anesthetic complications: no   No notable events documented.  Last Vitals:  Vitals:   09/26/24 1230 09/26/24 1245  BP: 117/63 124/64  Pulse: 88 80  Resp: 14 16  Temp:    SpO2: 94% 95%    Last Pain:  Vitals:   09/26/24 1245  TempSrc:   PainSc: Asleep        RLE Motor Response: No movement due to regional block (09/26/24 1245)        Debby FORBES Like

## 2024-09-26 NOTE — Anesthesia Procedure Notes (Signed)
 Procedure Name: LMA Insertion Date/Time: 09/26/2024 11:02 AM  Performed by: Debarah Chiquita LABOR, CRNAPre-anesthesia Checklist: Patient identified, Emergency Drugs available, Suction available and Patient being monitored Patient Re-evaluated:Patient Re-evaluated prior to induction Oxygen Delivery Method: Circle system utilized Preoxygenation: Pre-oxygenation with 100% oxygen Induction Type: IV induction Ventilation: Mask ventilation without difficulty LMA: LMA inserted LMA Size: 4.0 Number of attempts: 1 Airway Equipment and Method: Bite block Placement Confirmation: positive ETCO2 Tube secured with: Tape Dental Injury: Teeth and Oropharynx as per pre-operative assessment

## 2024-09-26 NOTE — Anesthesia Procedure Notes (Signed)
 Anesthesia Regional Block: Popliteal block   Pre-Anesthetic Checklist: , timeout performed,  Correct Patient, Correct Site, Correct Laterality,  Correct Procedure, Correct Position, site marked,  Risks and benefits discussed,  Surgical consent,  Pre-op evaluation,  At surgeon's request and post-op pain management  Laterality: Right  Prep: chloraprep       Needles:  Injection technique: Single-shot  Needle Type: Echogenic Needle     Needle Length: 10cm  Needle Gauge: 21     Additional Needles:   Narrative:  Start time: 09/26/2024 10:26 AM End time: 09/26/2024 10:29 AM Injection made incrementally with aspirations every 5 mL.  Performed by: Personally  Anesthesiologist: Lucious Debby BRAVO, MD  Additional Notes: No pain on injection. No increased resistance to injection. Injection made in 5cc increments. Good needle visualization. Patient tolerated the procedure well.

## 2024-09-27 ENCOUNTER — Encounter (HOSPITAL_BASED_OUTPATIENT_CLINIC_OR_DEPARTMENT_OTHER): Payer: Self-pay | Admitting: Orthopedic Surgery

## 2024-10-31 ENCOUNTER — Other Ambulatory Visit: Payer: Self-pay | Admitting: Family Medicine

## 2024-10-31 DIAGNOSIS — Z1231 Encounter for screening mammogram for malignant neoplasm of breast: Secondary | ICD-10-CM

## 2024-12-04 ENCOUNTER — Ambulatory Visit
